# Patient Record
Sex: Male | Born: 1975 | Race: White | Hispanic: No | Marital: Married | Smoking: Former smoker
Health system: Southern US, Community
[De-identification: ages and names within clinical notes are randomized; demographics above are authoritative.]

## PROBLEM LIST (undated history)

## (undated) DIAGNOSIS — G2581 Restless legs syndrome: Secondary | ICD-10-CM

## (undated) DIAGNOSIS — Z789 Other specified health status: Secondary | ICD-10-CM

## (undated) DIAGNOSIS — R079 Chest pain, unspecified: Secondary | ICD-10-CM

## (undated) DIAGNOSIS — M7712 Lateral epicondylitis, left elbow: Secondary | ICD-10-CM

## (undated) HISTORY — DX: Other specified health status: Z78.9

## (undated) HISTORY — DX: Lateral epicondylitis, left elbow: M77.12

## (undated) HISTORY — PX: WISDOM TOOTH EXTRACTION: SHX21

## (undated) HISTORY — DX: Restless legs syndrome: G25.81

## (undated) HISTORY — DX: Chest pain, unspecified: R07.9

---

## 2016-11-21 ENCOUNTER — Telehealth: Payer: Self-pay | Admitting: Behavioral Health

## 2016-11-21 ENCOUNTER — Encounter: Payer: Self-pay | Admitting: Behavioral Health

## 2016-11-21 NOTE — Telephone Encounter (Signed)
Pre-Visit Call completed with patient and chart updated.   Pre-Visit Info documented in Specialty Comments under [employer].    

## 2016-11-23 ENCOUNTER — Ambulatory Visit: Payer: Self-pay | Admitting: Family Medicine

## 2016-11-23 ENCOUNTER — Telehealth: Payer: Self-pay | Admitting: General Practice

## 2016-11-23 NOTE — Telephone Encounter (Signed)
OK to no charge. TY.

## 2016-11-23 NOTE — Telephone Encounter (Signed)
Pt called in at 3:00, he is scheduled to be established with provider. Pt says that he got stuck at work and didn't realize the time. Pt rescheduled to next week,   Should pt be charged?

## 2016-12-01 ENCOUNTER — Encounter: Payer: Self-pay | Admitting: Family Medicine

## 2016-12-01 ENCOUNTER — Ambulatory Visit (HOSPITAL_BASED_OUTPATIENT_CLINIC_OR_DEPARTMENT_OTHER)
Admission: RE | Admit: 2016-12-01 | Discharge: 2016-12-01 | Disposition: A | Discharge status: Home / Self Care | Payer: BLUE CROSS/BLUE SHIELD | Source: Ambulatory Visit | Attending: Family Medicine | Admitting: Family Medicine

## 2016-12-01 ENCOUNTER — Ambulatory Visit (INDEPENDENT_AMBULATORY_CARE_PROVIDER_SITE_OTHER): Payer: BLUE CROSS/BLUE SHIELD | Admitting: Family Medicine

## 2016-12-01 VITALS — BP 112/84 | HR 80 | Temp 98.7°F | Ht 70.5 in | Wt 212.0 lb

## 2016-12-01 DIAGNOSIS — G8929 Other chronic pain: Secondary | ICD-10-CM | POA: Diagnosis not present

## 2016-12-01 DIAGNOSIS — M25511 Pain in right shoulder: Secondary | ICD-10-CM | POA: Diagnosis not present

## 2016-12-01 DIAGNOSIS — G2581 Restless legs syndrome: Secondary | ICD-10-CM

## 2016-12-01 MED ORDER — ROPINIROLE HCL 0.5 MG PO TABS: 0.5000 mg | ORAL_TABLET | Freq: Every day | ORAL | 2 refills | Status: DC

## 2016-12-01 NOTE — Progress Notes (Signed)
Chief Complaint  Patient presents with  . Establish Care       New Patient Visit SUBJECTIVE: HPI: Tyler Stevens is an 41 y.o.male who is being seen for establishing care.  In his early 20's he started having RLS, used to be manageable, but appears to be bothering him more over the past 2-3 years. He feels a desire to move his legs. This happens more frequently when he is resting. He feels a relief once he does move his legs. He has never been on any medicine in the past. He denies any areas of easy bruising/bleeding and denies any personal history of iron deficiency.  R shoulder pain around 5-6 mo ago when lifting a cabinet. Does not catch or lock. Was improving, but steadily getting worse. No neurologic signs/symptoms. No palliation. Provocation include overhead activity, reaching behind him or lifting. Other than rest/activity modification, he is not tried anything at home.  No Known Allergies  Past Medical History:  Diagnosis Date  . Medical history non-contributory   . Restless legs    Past Surgical History:  Procedure Laterality Date  . WISDOM TOOTH EXTRACTION     Social History   Social History  . Marital status: Married   Social History Main Topics  . Smoking status: Former Smoker    Types: Cigarettes    Quit date: 07/13/2014  . Smokeless tobacco: Never Used  . Alcohol use No  . Drug use: Yes    Types: Marijuana   Family History  Problem Relation Age of Onset  . Cancer Neg Hx      Current Outpatient Prescriptions:  .  rOPINIRole (REQUIP) 0.5 MG tablet, Take 1 tablet (0.5 mg total) by mouth at bedtime. Take 1/2 tab daily for the first 3 days., Disp: 30 tablet, Rfl: 2  ROS MSK: +R shoulder pain  Neuro: Denies numbness/tingling   OBJECTIVE: BP 112/84 (BP Location: Left Arm, Patient Position: Sitting, Cuff Size: Large)   Pulse 80   Temp 98.7 F (37.1 C) (Oral)   Ht 5' 10.5" (1.791 m)   Wt 212 lb (96.2 kg)   SpO2 94%   BMI 29.99 kg/m   Constitutional: -   VS reviewed -  Well developed, well nourished, appears stated age -  No apparent distress  Psychiatric: -  Oriented to person, place, and time -  Memory intact -  Affect and mood normal -  Fluent conversation, good eye contact -  Judgment and insight age appropriate  Eye: -  Conjunctivae clear, no discharge -  Pupils symmetric, round, reactive to light  ENMT: -  MMM    Pharynx moist, no exudate, no erythema  Neck: -  No gross swelling, no palpable masses -  Thyroid midline, not enlarged, mobile, no palpable masses  Cardiovascular: -  RRR -  No LE edema  Respiratory: -  Normal respiratory effort, no accessory muscle use, no retraction -  Breath sounds equal, no wheezes, no ronchi, no crackles  Neurological:  -  CN II - XII grossly intact -  Sensation grossly intact to light touch, equal bilaterally -  DTR's in UE's b/l   Musculoskeletal: -  No clubbing, no cyanosis -  R shoulder- No deformity or TTP, decreased overhead range of motion, positive Neer's, empty can, [employer]; negative crossover, crossover O'Brien's, O'Brien's, speed's, Yergason's, liftoff   Skin: -  No significant lesion on inspection -  Warm and dry to palpation   ASSESSMENT/PLAN: Chronic right shoulder pain - Plan: DG Shoulder Right  RLS (restless legs syndrome) - Plan: CBC, Ferritin, IBC panel, rOPINIRole (REQUIP) 0.5 MG tablet  Concern for rotator cuff despite age. I am not particularly concerned with labral pathosis given exam. Will rec heat, activity as tolerated, home stretches/exercises. Call if no better, will try PT. I would like to see him again if still no better. We'll check iron studies to make sure he is not deficient. Start Requip. Half a tab a day for the first 3 days then 1 tab daily. Patient should return in 1 mo to recheck RLS. The patient voiced understanding and agreement to the plan.   Jilda Roche [place], DO 12/01/16  4:41 PM

## 2016-12-01 NOTE — Patient Instructions (Signed)
Give [place] 2-3 business days to get the results of your labs back. If labs are normal, you will likely receive a letter in the mail unless you have MyChart. This can take longer than 2-3 business days.   Let me know in the next 3-4 weeks if you are not getting better. We will try physical therapy at that point.   EXERCISES  RANGE OF MOTION (ROM) AND STRETCHING EXERCISES These exercises may help you when beginning to rehabilitate your injury. Your symptoms may resolve with or without further involvement from your physician, physical therapist or athletic trainer. While completing these exercises, remember:   Restoring tissue flexibility helps normal motion to return to the joints. This allows healthier, less painful movement and activity.  An effective stretch should be held for at least 30 seconds.  A stretch should never be painful. You should only feel a gentle lengthening or release in the stretched tissue.  ROM - Pendulum  Bend at the waist so that your right / left arm falls away from your body. Support yourself with your opposite hand on a solid surface, such as a table or a countertop.  Your right / left arm should be perpendicular to the ground. If it is not perpendicular, you need to lean over farther. Relax the muscles in your right / left arm and shoulder as much as possible.  Gently sway your hips and trunk so they move your right / left arm without any use of your right / left shoulder muscles.  Progress your movements so that your right / left arm moves side to side, then forward and backward, and finally, both clockwise and counterclockwise.  Complete 10-15 repetitions in each direction. Many people use this exercise to relieve discomfort in their shoulder as well as to gain range of motion. Repeat 2-3 times. Complete this exercise once per day.  STRETCH - Flexion, Standing  Stand with good posture. With an underhand grip on your right / left hand and an overhand grip on the  opposite hand, grasp a broomstick or cane so that your hands are a little more than shoulder-width apart.  Keeping your right / left elbow straight and shoulder muscles relaxed, push the stick with your opposite hand to raise your right / left arm in front of your body and then overhead. Raise your arm until you feel a stretch in your right / left shoulder, but before you have increased shoulder pain.  Try to avoid shrugging your right / left shoulder as your arm rises by keeping your shoulder blade tucked down and toward your mid-back spine. Hold 15-20 seconds.  Slowly return to the starting position. Repeat 2-3 times. Complete this exercise once per day.  STRETCH - Internal Rotation  Place your right / left hand behind your back, palm-up.  Throw a towel or belt over your opposite shoulder. Grasp the towel/belt with your right / left hand.  While keeping an upright posture, gently pull up on the towel/belt until you feel a stretch in the front of your right / left shoulder.  Avoid shrugging your right / left shoulder as your arm rises by keeping your shoulder blade tucked down and toward your mid-back spine.  Hold 15-20. Release the stretch by lowering your opposite hand. Repeat 2-3 times. Complete this exercise once per day.  STRETCH - External Rotation and Abduction  Stagger your stance through a doorframe. It does not matter which foot is forward.  As instructed by your physician, physical therapist  or athletic trainer, place your hands: ? And forearms above your head and on the door frame. ? And forearms at head-height and on the door frame. ? At elbow-height and on the door frame.  Keeping your head and chest upright and your stomach muscles tight to prevent over-extending your low-back, slowly shift your weight onto your front foot until you feel a stretch across your chest and/or in the front of your shoulders.  Hold 15-20 seconds. Shift your weight to your back foot to  release the stretch. Repeat 2-3 times. Complete this stretch once per day.   STRENGTHENING EXERCISES  These exercises may help you when beginning to rehabilitate your injury. They may resolve your symptoms with or without further involvement from your physician, physical therapist or athletic trainer. While completing these exercises, remember:   Muscles can gain both the endurance and the strength needed for everyday activities through controlled exercises.  Complete these exercises as instructed by your physician, physical therapist or athletic trainer. Progress the resistance and repetitions only as guided.  You may experience muscle soreness or fatigue, but the pain or discomfort you are trying to eliminate should never worsen during these exercises. If this pain does worsen, stop and make certain you are following the directions exactly. If the pain is still present after adjustments, discontinue the exercise until you can discuss the trouble with your clinician.  If advised by your physician, during your recovery, avoid activity or exercises which involve actions that place your right / left hand or elbow above your head or behind your back or head. These positions stress the tissues which are trying to heal.  STRENGTH - Scapular Depression and Adduction  With good posture, sit on a firm chair. Supported your arms in front of you with pillows, arm rests or a table top. Have your elbows in line with the sides of your body.  Gently draw your shoulder blades down and toward your mid-back spine. Gradually increase the tension without tensing the muscles along the top of your shoulders and the back of your neck.  Hold for 5 seconds. Slowly release the tension and relax your muscles completely before completing the next repetition.  After you have practiced this exercise, remove the arm support and complete it in standing as well as sitting. Repeat 2 times. Complete this exercise every other  day.   STRENGTH - External Rotators  Secure a rubber exercise band/tubing to a fixed object so that it is at the same height as your right / left elbow when you are standing or sitting on a firm surface.  Stand or sit so that the secured exercise band/tubing is at your side that is not injured.  Bend your elbow 90 degrees. Place a folded towel or small pillow under your right / left arm so that your elbow is a few inches away from your side.  Keeping the tension on the exercise band/tubing, pull it away from your body, as if pivoting on your elbow. Be sure to keep your body steady so that the movement is only coming from your shoulder rotating.  Hold 5 seconds. Release the tension in a controlled manner as you return to the starting position. Repeat 2 times. Complete this exercise every other day.   STRENGTH - Supraspinatus  Stand or sit with good posture. Grasp a 2-3 lb weight or an exercise band/tubing so that your hand is "thumbs-up," like when you shake hands.  Slowly lift your right / left hand from  your thigh into the air, traveling about 30 degrees from straight out at your side. Lift your hand to shoulder height or as far as you can without increasing any shoulder pain. Initially, many people do not lift their hands above shoulder height.  Avoid shrugging your right / left shoulder as your arm rises by keeping your shoulder blade tucked down and toward your mid-back spine.  Hold for 4-5 seconds. Control the descent of your hand as you slowly return to your starting position. Repeat 2 times. Complete this exercise every other day.   STRENGTH - Shoulder Extensors  Secure a rubber exercise band/tubing so that it is at the height of your shoulders when you are either standing or sitting on a firm arm-less chair.  With a thumbs-up grip, grasp an end of the band/tubing in each hand. Straighten your elbows and lift your hands straight in front of you at shoulder height. Step back away  from the secured end of band/tubing until it becomes tense.  Squeezing your shoulder blades together, pull your hands down to the sides of your thighs. Do not allow your hands to go behind you.  Hold for 5 seconds. Slowly ease the tension on the band/tubing as you reverse the directions and return to the starting position. Repeat 2 times. Complete this exercise every other day.   STRENGTH - Scapular Retractors  Secure a rubber exercise band/tubing so that it is at the height of your shoulders when you are either standing or sitting on a firm arm-less chair.  With a palm-down grip, grasp an end of the band/tubing in each hand. Straighten your elbows and lift your hands straight in front of you at shoulder height. Step back away from the secured end of band/tubing until it becomes tense.  Squeezing your shoulder blades together, draw your elbows back as you bend them. Keep your upper arm lifted away from your body throughout the exercise.  Hold 5 seconds. Slowly ease the tension on the band/tubing as you reverse the directions and return to the starting position. Repeat 2 times. Complete this exercise every other day.  STRENGTH - Scapular Depressors  Find a sturdy chair without wheels, such as a from a dining room table.  Keeping your feet on the floor, lift your bottom from the seat and lock your elbows.  Keeping your elbows straight, allow gravity to pull your body weight down. Your shoulders will rise toward your ears.  Raise your body against gravity by drawing your shoulder blades down your back, shortening the distance between your shoulders and ears. Although your feet should always maintain contact with the floor, your feet should progressively support less body weight as you get stronger.  Hold 5 seconds. In a controlled and slow manner, lower your body weight to begin the next repetition. Repeat 2 times. Complete this exercise every other day.   This information is not  intended to replace advice given to you by your health care provider. Make sure you discuss any questions you have with your health care provider.  Document Released: 01/12/2005 Document Revised: 03/21/2014 Document Reviewed: 06/12/2008 Elsevier Interactive Patient Education [employer]

## 2016-12-01 NOTE — Progress Notes (Signed)
Pre visit review using our clinic review tool, if applicable. No additional management support is needed unless otherwise documented below in the visit note. 

## 2016-12-02 LAB — IBC PANEL
Iron: 78 ug/dL (ref 42–165)
SATURATION RATIOS: 22.4 % (ref 20.0–50.0)
TRANSFERRIN: 249 mg/dL (ref 212.0–360.0)

## 2016-12-02 LAB — CBC
HEMATOCRIT: 43.3 % (ref 39.0–52.0)
HEMOGLOBIN: 13.9 g/dL (ref 13.0–17.0)
MCHC: 32.2 g/dL (ref 30.0–36.0)
MCV: 85.5 fl (ref 78.0–100.0)
Platelets: 398 10*3/uL (ref 150.0–400.0)
RBC: 5.06 Mil/uL (ref 4.22–5.81)
RDW: 13.2 % (ref 11.5–15.5)
WBC: 9.1 10*3/uL (ref 4.0–10.5)

## 2016-12-02 LAB — FERRITIN: Ferritin: 101.7 ng/mL (ref 22.0–322.0)

## 2017-07-25 ENCOUNTER — Ambulatory Visit: Payer: BLUE CROSS/BLUE SHIELD | Admitting: Family Medicine

## 2017-07-25 ENCOUNTER — Encounter: Payer: Self-pay | Admitting: Family Medicine

## 2017-07-25 VITALS — BP 112/76 | HR 92 | Temp 98.4°F | Ht 70.0 in | Wt 211.0 lb

## 2017-07-25 DIAGNOSIS — G2581 Restless legs syndrome: Secondary | ICD-10-CM | POA: Diagnosis not present

## 2017-07-25 DIAGNOSIS — M7712 Lateral epicondylitis, left elbow: Secondary | ICD-10-CM | POA: Diagnosis not present

## 2017-07-25 HISTORY — DX: Lateral epicondylitis, left elbow: M77.12

## 2017-07-25 HISTORY — DX: Restless legs syndrome: G25.81

## 2017-07-25 NOTE — Progress Notes (Signed)
Pre visit review using our clinic review tool, if applicable. No additional management support is needed unless otherwise documented below in the visit note. 

## 2017-07-25 NOTE — Progress Notes (Signed)
Musculoskeletal Exam  Patient: Tyler Stevens DOB: 10/23/1975  DOS: 07/25/2017  SUBJECTIVE:  Chief Complaint:   Chief Complaint  Patient presents with  . Elbow Injury    left    Tyler Stevens is a 42 y.o.  male for evaluation and treatment of L elbow pain.   Onset:  4 weeks ago.  No inj or change in activity, does work in [employer].  Location: Lateral Elbow Character:  aching and sharp when he uses it and touches Progression of issue:  is unchanged Associated symptoms: None Treatment: to date has been- compression brace.   Neurovascular symptoms: no  ROS: Musculoskeletal/Extremities: +Elbow pain  Past Medical History:  Diagnosis Date  . Medical history non-contributory   . Restless legs     Objective: VITAL SIGNS: BP 112/76 (BP Location: Left Arm, Patient Position: Sitting, Cuff Size: Normal)   Pulse 92   Temp 98.4 F (36.9 C) (Oral)   Ht  (1.778 m)   Wt 211 lb (95.7 kg)   SpO2 95%   BMI 30.28 kg/m  Constitutional: Well formed, well developed. No acute distress. Cardiovascular: Brisk cap refill Thorax & Lungs: No accessory muscle use Musculoskeletal: L elbow.   Normal active range of motion: yes.   Normal passive range of motion: yes Tenderness to palpation: yes, over lat epicondyle Deformity: no Ecchymosis: no +pain with resisted wrist extension Neurologic: Normal sensory function. No focal deficits noted. Psychiatric: Normal mood. Age appropriate judgment and insight. Alert & oriented x 3.    Assessment:  Lateral epicondylitis of left elbow  RLS (restless legs syndrome)  Plan: NSAIDs, Tylenol, ice, forearm strap, stretches/exercises. Trial Mg as Requip not as satisfying.  F/u prn.  The patient voiced understanding and agreement to the plan.   Jilda Roche [place], DO 07/25/17  2:46 PM

## 2017-07-25 NOTE — Patient Instructions (Signed)
Ice/cold pack over area for 10-15 min twice daily.  Ibuprofen 400-600 mg (2-3 over the counter strength tabs) every 6 hours as needed for pain.  OK to take Tylenol 1000 mg (2 extra strength tabs) or 975 mg (3 regular strength tabs) every 6 hours as needed.  Forearm strap for tennis elbow is available over the counter. Band-IT is a common brand that people have had success with.  Try Magnesium over the counter 200-250 mg as needed for the issue. Stay well hydrated.   Elbow and Forearm Exercises It is normal to feel mild stretching, pulling, tightness, or discomfort as you do these exercises, but you should stop right away if you feel sudden pain or your pain gets worse. RANGE OF MOTION EXERCISES These exercises warm up your muscles and joints and improve the movement and flexibility of your injured elbow and forearm. These exercises also help to relieve pain, numbness, and tingling.These exercises are done using the muscles in your injured elbow and forearm. Exercise A: Elbow Flexion, Active 1. Hold your left / right arm at your side, and bend your elbow as far as you can using your left / right arm muscles. 2. Hold this position for 30 seconds. 3. Slowly return to the starting position. Repeat 2 times. Complete this exercise 3 times per week. Exercise B: Elbow Extension, Active 1. Hold your left / right arm at your side, and straighten your elbow as much as you can using your left / right arm muscles. 2. Hold this position for 30 seconds. 3. Slowly return to the starting position. Repeat 2 times. Complete this exercise 3 times per week. Exercise C: Forearm Rotation, Supination, Active 1. Stand or sit with your elbows at your sides. 2. Bend your left / right elbow to an "L" shape (90 degrees). 3. Turn your palm upward until you feel a gentle stretch on the inside of your forearm. 4. Hold this position for 30 seconds. 5. Slowly release and return to the starting position. Repeat 2  times. Complete this exercise 3 times per week. Exercise D: Forearm Rotation, Pronation, Active 1. Stand or sit with your elbows at your side. 2. Bend your left / right elbow to an "L" shape (90 degrees). 3. Turn your left / right palm downward until you feel a gentle stretch on the top of your forearm. 4. Hold this position for 30 seconds. 5. Slowly release and return to the starting position. Repeat2 times. Complete this exercise 3 times per week. STRETCHING EXERCISES These exercises warm up your muscles and joints and improve the movement and flexibility of your injured elbow and forearm. These exercises also help to relieve pain, numbness, and tingling.These exercises are done using your healthy elbow and forearm to help stretch the muscles in your injured elbow and forearm. Exercise E: Elbow Flexion, Active-Assisted  1. Hold your left / right arm at your side, and bend your elbow as much as you can using your left / right arm muscles. 2. Use your other hand to bend your left / right elbow farther. To do this, gently push up on your forearm until you feel a gentle stretch on the back of your elbow. 3. Hold this position for 30 seconds. 4. Slowly return to the starting position. Repeat 2 times. Complete this exercise 3 times per week. Exercise F: Elbow Extension, Active-Assisted  1. Hold your left / right arm at your side, and straighten your elbow as much as you can using your left / right  arm muscles. 2. Use your other hand to straighten the left / right elbow farther. To do this, gently push down on your forearm until you feel a gentle stretch on the inside of your elbow. 3. Hold this position for 30 seconds. 4. Slowly return to the starting position. Repeat 2 times. Complete this exercise 3 times per weeky. Exercise G: Forearm Rotation, Supination, Active-Assisted  1. Sit with your left / right elbow bent in an "L" shape (90 degrees) with your forearm resting on a  table. 2. Keeping your upper body and shoulder still, rotate your forearm so your left / right palm faces upward. 3. Use your other hand to help rotate your forearm further until you feel a gentle to moderate stretch. 4. Hold this position for 30 seconds. 5. Slowly release the stretch and return to the starting position. Repeat 2 times. Complete this exercise 3 times per week. Exercise H: Forearm Rotation, Pronation, Active-Assisted  1. Sit with your left / right elbow bent in an "L" shape (90 degrees) with your forearm resting on a table. 2. Keeping your upper body and shoulder still, rotate your forearm so your palm faces the tabletop. 3. Use your other hand to help rotate your forearm further until you feel a gentle to moderate stretch. 4. Hold this position for 30 seconds. 5. Slowly release the stretch and return to the starting position. Repeat 2 times. Complete this exercise 3 times per week. Exercise I: Elbow Flexion, Supine, Passive 1. Lie on your back. 2. Extend your left / right arm up in the air, bracing it with your other hand. 3. Let your left / right your hand slowly lower toward your shoulder, while your elbow stays pointed toward the ceiling. You should feel a gentle stretch along the back of your upper arm and elbow. 4. If instructed by your health care provider, you may increase the intensity of your stretch by adding a small wrist weight or hand weight. 5. Hold this position for 3 seconds. 6. Slowly return to the starting position. Repeat 2 times. Complete this exercise 3 times per week. Exercise J: Elbow Extension, Supine, Passive  1. Lie on your back. Make sure that you are in a comfortable position that lets you relax your arm muscles. 2. Place a folded towel under your left / right upper arm so your elbow and shoulder are at the same height. Straighten your left / right arm so your elbow does not rest on the bed or towel. 3. Let the weight of your hand stretch your  elbow. Keep your arm and chest muscles relaxed. You should feel a stretch on the inside of your elbow. 4. If told by your health care provider, you may increase the intensity of your stretch by adding a small wrist weight or hand weight. 5. Hold this position for 30 seconds. 6. Slowly release the stretch. Repeat 2 times. Complete this exercise 3 times per week. STRENGTHENING EXERCISES These exercises build strength and endurance in your elbow and forearm. Endurance is the ability to use your muscles for a long time, even after they get tired. Exercise K: Elbow Flexion, Isometric  1. Stand or sit up straight. 2. Bend your left / right elbow in an "L" shape (90 degrees) and turn your palm up so your forearm is at the height of your waist. 3. Place your other hand on top of your forearm. Gently push down as your left / right arm resists. Push as hard as you  can with both arms without causing any pain or movement at your left / right elbow. 4. Hold this position for 3 seconds. 5. Slowly release the tension in both arms. Let your muscles relax completely before repeating. Repeat 2 times. Complete this exercise 3 times per week. Exercise L: Elbow Extensors, Isometric  1. Stand or sit up straight. 2. Place your left / right arm so your palm faces your abdomen and it is at the height of your waist. 3. Place your other hand on the underside of your forearm. Gently push up as your left / right arm resists. Push as hard as you can with both arms, without causing any pain or movement at your left / right elbow. 4. Hold this position for 3 seconds. 5. Slowly release the tension in both arms. Let your muscles relax completely before repeating. Repeat _______2___ times. Complete this exercise 3 times per week. Exercise M: Elbow Flexion With Forearm Palm Up  1. Sit upright on a firm chair without armrests, or stand. 2. Place your left / right arm at your side with your palm facing forward. 3. Holding a 5  lbweight or gripping a rubber exercise band or tubing, bend your elbow to bring your hand toward your shoulder. 4. Hold this position for 3 seconds. 5. Slowly return to the starting position. Repeat 2 times. Complete this exercise 3 times per week. Exercise N: Elbow Extension  1. Sit on a firm chair without armrests, or stand. 2. Keeping your upper arms at your sides, bring both hands up toward your left / right shoulder while you grip a rubber exercise band or tubing. Your left / right hand should be just below the other hand. 3. Straighten your left / right elbow. 4. Hold this position for 3 seconds. 5. Control the resistance of the band or tubing as your hand returns to your side. Repeat 2 times. Complete this exercise 3 times per week. Exercise O: Forearm Rotation, Supination  1. Sit with your left / right forearm supported on a table. Keep your elbow at waist height. 2. Rest your hand over the edge of the table with your palm facing down. 3. Gently hold a lightweight hammer. 4. Without moving your elbow, slowly rotate your forearm to turn your palm and hand upward to a "thumbs-up" position. 5. Hold this position for 3 seconds. 6. Slowly return to the starting position. Repeat 2 times. Complete this exercise 3 times per week. Exercise P: Forearm Rotation, Pronation  1. Sit with your left / right forearm supported on a table. Keep your elbow below shoulder height. 2. Rest your hand over the edge of the table with your palm facing up. 3. Gently hold a lightweight hammer. 4. Without moving your elbow, slowly rotate your forearm to turn your palm and hand upward to a "thumbs-up" position. 5. Hold this position for 3 seconds. 6. Slowly return to the starting position. Repeat 2 times. Complete this exercise 3 times per week.  Make sure you discuss any questions you have with your health care provider. Document Released: 01/12/2005 Document Revised: 07/09/2015 Document Reviewed:  11/23/2014 Elsevier Interactive Patient Education  [employer].

## 2018-04-23 ENCOUNTER — Ambulatory Visit: Payer: Self-pay

## 2018-04-23 NOTE — Telephone Encounter (Signed)
Returned call to patient who states that he has had several bouts of chest pain.  He states that it is in the center of his chest and travels to his sides but not into his back arm.  He does not get nauseous, sweats, SOB. He states that raising his arm relieves the pain.  He denies injury. He rates the pain at 5-6. He has no risk factors. Appointment scheduled per protocol.  Care advice read to patient. Pt verbalized understanding of all instructions.  Reason for Disposition . [1] Chest pain lasts > 5 minutes AND [2] occurred > 3 days ago (72 hours) AND [3] NO chest pain or cardiac symptoms now  Answer Assessment - Initial Assessment Questions 1. LOCATION: "Where does it hurt?"       Middle of chest 2. RADIATION: "Does the pain go anywhere else?" (e.g., into neck, jaw, arms, back)     Radiates to sides 3. ONSET: "When did the chest pain begin?" (Minutes, hours or days)     Saturday night 4. PATTERN "Does the pain come and go, or has it been constant since it started?"  "Does it get worse with exertion?"      no 5. DURATION: "How long does it last" (e.g., seconds, minutes, hours)     6. SEVERITY: "How bad is the pain?"  (e.g., Scale 1-10; mild, moderate, or severe)    - MILD (1-3): doesn't interfere with normal activities     - MODERATE (4-7): interferes with normal activities or awakens from sleep    - SEVERE (8-10): excruciating pain, unable to do any normal activities    5-6 7. CARDIAC RISK FACTORS: "Do you have any history of heart problems or risk factors for heart disease?" (e.g., prior heart attack, angina; high blood pressure, diabetes, being overweight, high cholesterol, smoking, or strong family history of heart disease)     no 8. PULMONARY RISK FACTORS: "Do you have any history of lung disease?"  (e.g., blood clots in lung, asthma, emphysema, birth control pills)    no 9. CAUSE: "What do you think is causing the chest pain?"     nothing 10. OTHER SYMPTOMS: "Do you have any  other symptoms?" (e.g., dizziness, nausea, vomiting, sweating, fever, difficulty breathing, cough)       no 11. PREGNANCY: "Is there any chance you are pregnant?" "When was your last menstrual period?"       No  Protocols used: CHEST PAIN-A-AH

## 2018-04-24 ENCOUNTER — Encounter: Payer: Self-pay | Admitting: Medical

## 2018-04-24 ENCOUNTER — Telehealth: Payer: Self-pay | Admitting: Medical

## 2018-04-24 ENCOUNTER — Ambulatory Visit (INDEPENDENT_AMBULATORY_CARE_PROVIDER_SITE_OTHER): Payer: BLUE CROSS/BLUE SHIELD | Admitting: Medical

## 2018-04-24 VITALS — BP 125/78 | HR 98 | Temp 98.3°F | Resp 16 | Ht 70.0 in | Wt 215.8 lb

## 2018-04-24 DIAGNOSIS — R0789 Other chest pain: Secondary | ICD-10-CM

## 2018-04-24 DIAGNOSIS — Z87891 Personal history of nicotine dependence: Secondary | ICD-10-CM | POA: Diagnosis not present

## 2018-04-24 DIAGNOSIS — R079 Chest pain, unspecified: Secondary | ICD-10-CM | POA: Diagnosis not present

## 2018-04-24 LAB — TROPONIN I

## 2018-04-24 NOTE — Patient Instructions (Addendum)
You had some atypical chest pain recently that went away after 5 to 10 minutes with each event.  Last event of chest pain occurred with activity.  Your EKG shows normal sinus rhythm.  No ischemia seen on EKG.  Since the last event may have been on Sunday night, will get a troponin level today and see if there is any elevation.  With your reported pain recently, history of smoking and family history of CAD, I did go ahead and place referral for you to see cardiologist.  During the interim if you have any recurrent episodes of chest pain then recommend that you be seen in the emergency department for EKG at the same time and repeat cardiac protein studies.  You do have history of some heartburn on occasion.  No overt symptoms recently.  But I do think since some of your symptoms have occurred while sleeping it is best for you to get some famotidine over-the-counter and use 1 to 2 tablets early evening.  This would prevent any heartburn from occurring.  This would be helpful as sometimes heartburn can be interpreted as chest pain.   We will place future labs for you to get done on Monday or Tuesday next week.  These will be metabolic panel and lipid panel.  This will start to assess some of your risk factors.  If no cardiologist appointment by late next week then I do want you to see your PCP or me.  You could try to schedule a close to 1 PM in the afternoon or other times.  1:00 appointment time might be more convenient/quicker for you.

## 2018-04-24 NOTE — Progress Notes (Signed)
Subjective:    Patient ID: Tyler Stevens, male    DOB: Aug 22, 1975, 43 y.o.   MRN: [id number]  HPI  Pt in states several weeks ago he woke up with some chest pain. He states he noticed the pain transitioning from sleeping and waking. He thinks chest pain would wake him up. 1st time he thought pain resolved by the time he woke up and walked to the door. The second time he states he had same type pain. Then pain eased off after few minutes. No belching or burping. He states pain did not feel like heart burn.   He also reports 3rd time had some chest pain that occurred after some vigorous intercourse. Pain lasted for about 10 minutes then subsided. Pt thinks Saturday night or Sunday night was when had 3rd event. He is not sure.  Pain did not have any jaw pain, shoulder pain or shortness of pain during above 3 events.  Pt used to smoke. Stopped 3 years ago. He did smoke 43 year old until 43 yo. He smoked about a pack a day. Pt dad had 2 stents. His dad is a smoker. Dad had stents placed at 41 yo. Pt not aware if he has high cholesterol. No known diabetes. Pt does vape marijuana. About 1 gram of concentrate marijuana. No high blood pressure.  Some hx of heartburn. He is not taking any meds over the counter. He states if eats healthy won't need anything. He does not want to use meds in past. But understands short term benefit while working up the above.     Review of Systems  Constitutional: Negative for chills, fatigue and fever.  Respiratory: Negative for cough, chest tightness, shortness of breath and wheezing.   Cardiovascular: Positive for chest pain. Negative for palpitations.       See hpi. But note no current pain.  Gastrointestinal: Negative for abdominal pain.  Musculoskeletal: Negative for back pain, myalgias and neck stiffness.  Skin: Negative for rash.  Neurological: Negative for dizziness, syncope, weakness and headaches.  Hematological: Negative for adenopathy.    Psychiatric/Behavioral: Negative for agitation and behavioral problems. The patient is not nervous/anxious.     Past Medical History:  Diagnosis Date  . Medical history non-contributory   . Restless legs      Social History   Socioeconomic History  . Marital status: Married    Spouse name: Not on file  . Number of children: Not on file  . Years of education: Not on file  . Highest education level: Not on file  Occupational History  . Not on file  Social Needs  . Financial resource strain: Not on file  . Food insecurity:    Worry: Not on file    Inability: Not on file  . Transportation needs:    Medical: Not on file    Non-medical: Not on file  Tobacco Use  . Smoking status: Former Smoker    Types: Cigarettes    Last attempt to quit: 07/13/2014    Years since quitting: 3.7  . Smokeless tobacco: Never Used  Substance and Sexual Activity  . Alcohol use: No  . Drug use: Yes    Types: Marijuana  . Sexual activity: Not on file  Lifestyle  . Physical activity:    Days per week: Not on file    Minutes per session: Not on file  . Stress: Not on file  Relationships  . Social connections:    Talks on phone: Not on file  Gets together: Not on file    Attends religious service: Not on file    Active member of club or organization: Not on file    Attends meetings of clubs or organizations: Not on file    Relationship status: Not on file  . Intimate partner violence:    Fear of current or ex partner: Not on file    Emotionally abused: Not on file    Physically abused: Not on file    Forced sexual activity: Not on file  Other Topics Concern  . Not on file  Social History Narrative  . Not on file    Past Surgical History:  Procedure Laterality Date  . WISDOM TOOTH EXTRACTION      Family History  Problem Relation Age of Onset  . Cancer Neg Hx     No Known Allergies  No current outpatient medications on file prior to visit.   No current facility-administered  medications on file prior to visit.     Pulse 98   Temp 98.3 F (36.8 C) (Oral)   Resp 16   Ht 5\' 10"  (1.778 m)   Wt 215 lb 12.8 oz (97.9 kg)   SpO2 97%   BMI 30.96 kg/m       Objective:   Physical Exam  General Mental Status- Alert. General Appearance- Not in acute distress.   Skin General: Color- Normal Color. Moisture- Normal Moisture.  Neck Carotid Arteries- Normal color. Moisture- Normal Moisture. No carotid bruits. No JVD.  Chest and Lung Exam Auscultation: Breath Sounds:-Normal.  Cardiovascular Auscultation:Rythm- Regular. Murmurs & Other Heart Sounds:Auscultation of the heart reveals- No Murmurs.  Abdomen Inspection:-Inspeection Normal. Palpation/Percussion:Note:No mass. Palpation and Percussion of the abdomen reveal- Non Tender, Non Distended + BS, no rebound or guarding.    Neurologic Cranial Nerve exam:- CN III-XII intact(No nystagmus), symmetric smile. Strength:- 5/5 equal and symmetric strength both upper and lower extremities.  Anterior chest- no pain on palpation.     Assessment & Plan:  [phone].  You had some atypical chest pain recently that went away after 5 to 10 minutes with each event.  Last event of chest pain occurred with activity.  Your EKG shows normal sinus rhythm.  No ischemia seen on EKG.  Since the last event may have been on Sunday night, will get a troponin level today and see if there is any elevation.  With your reported pain recently, history of smoking and family history of CAD, I did go ahead and place referral for you to see cardiologist.  During the interim if you have any recurrent episodes of chest pain then recommend that you be seen in the emergency department for EKG at the same time and repeat cardiac protein studies.  You do have history of some heartburn on occasion.  No overt symptoms recently.  But I do think since some of your symptoms have occurred while sleeping it is best for you to  get some famotidine  over-the-counter and use 1 to 2 tablets early evening.  This would prevent any heartburn from occurring.  This would be helpful as sometimes heartburn can be interpreted as chest pain.   We will place future labs for you to get done on Monday or Tuesday next week.  These will be metabolic panel and lipid panel.  This will start to assess some of your risk factors.  If no cardiologist appointment by late next week then I do want you to see your PCP or me.  You could  try to schedule a close to 1 PM in the afternoon or other times.  1:00 appointment time might be more convenient/quicker for you.  Esperanza RichtersEdward Merwyn Hodapp, PA-C

## 2018-04-24 NOTE — Telephone Encounter (Signed)
Pt blood pressure not put in epic Will you help to find vital sheet so can put it in chart tomorrow. Let me know if you found. Can you put that in or do I have to make the addendum.

## 2018-04-25 ENCOUNTER — Encounter: Payer: Self-pay | Admitting: Medical

## 2018-04-25 NOTE — Telephone Encounter (Signed)
Done

## 2018-04-27 ENCOUNTER — Telehealth: Payer: Self-pay | Admitting: *Deleted

## 2018-04-27 NOTE — Telephone Encounter (Signed)
Received Lab Report results from [facility]; forwarded to provider/SLS  02/14

## 2018-04-27 NOTE — Telephone Encounter (Signed)
Received Dermatopathology Report results from [facility]; forwarded to provider/SLS 02/14     

## 2018-05-04 ENCOUNTER — Ambulatory Visit: Payer: BLUE CROSS/BLUE SHIELD | Admitting: Family Medicine

## 2018-05-07 DIAGNOSIS — R079 Chest pain, unspecified: Secondary | ICD-10-CM

## 2018-05-07 HISTORY — DX: Chest pain, unspecified: R07.9

## 2018-05-07 NOTE — Progress Notes (Signed)
Cardiology Office Note:    Date:  05/08/2018   ID:  Tyler Stevens, DOB [birth year], MRN [id number]  PCP:  Sharlene Dory, DO  Cardiologist:  Norman Herrlich, MD   Referring MD: Esperanza Richters, PA-C  ASSESSMENT:    1. Chest pain in adult   2.      Chronic heartburn PLAN:    In order of problems listed above:  1. Chest pain is atypical nonexertional but has elements of angina.  He will undergo further evaluation with cardiac CTA and as part of his labs for contrast I will check a lipid profile.  Given a prescription for nitroglycerin that he can use if he has a severe episode.  Despite the absence of diabetes hypertension hyperlipidemia he is at increased risk with age male sex and former smoking status.  I strongly encouraged her to continue with smoking cessation he is now 3 years from his quit date 2. He has symptoms quite suggestive of reflux and potentially has esophageal chest pain start on a PPI await results of CTA if normal would benefit from ongoing treatment and referral to GI  Next appointment in 6 weeks   Medication Adjustments/Labs and Tests Ordered: Current medicines are reviewed at length with the patient today.  Concerns regarding medicines are outlined above.  Orders Placed This Encounter  Procedures  . CT CORONARY MORPH W/CTA COR W/SCORE W/CA W/CM &/OR WO/CM  . CT CORONARY FRACTIONAL FLOW RESERVE DATA PREP  . CT CORONARY FRACTIONAL FLOW RESERVE FLUID ANALYSIS  . EKG 12-Lead   No orders of the defined types were placed in this encounter.    Chief Complaint  Patient presents with  . Chest Pain    History of Present Illness:    Tyler Stevens is a 43 y.o. male who is being seen today for the evaluation of chest pain at the request of Esperanza Richters, [place].   EKG 04/24/18 Normal, troponin I undetectable.  Last month he has had several episodes of chest pain initially occurred about once a week would awaken from sleep with brief momentary he  described as mild to moderate substernal radiated to both sides of his chest and we get out of bed and it would resolve and last a few minutes no associated dyspepsia heartburn pain on swallowing shortness of breath diaphoresis nausea or vomiting it was not pleuritic in nature.  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  He really has no cardiovascular risk factors except being a previous smoker he has not had a lipid profile and will perform it today along with a CMP in order to look at renal function for cardiac CTA.  He does have a history of what he calls chronic heartburn and is better when he takes milk and over-the-counter antacids.  He has no pain on swallowing the evenings that he had the episodes he did not have acid washing to his mouth and he is never had a GI evaluation.  Reviewed the options for evaluation of CAD and after discussion of traditional stress testing versus cardiac CTA have opted for cardiac CTA with additional information of evaluating the origin of coronary arteries for anomalous coronary artery disease the presence or absence of CAD and the other associated findings of a chest CT particularly looking for signs of chronic  esophageal disease in the interim and when to place him on a PPI.  I gave him a prescription for nitroglycerin if he has another severe episode.  Note he had an outpatient troponin which was normal is a low risk group.  He has no history of congenital rheumatic heart disease or heart murmur he has had no syncope.  Past Medical History:  Diagnosis Date  . Chest pain in adult 05/07/2018  . Lateral epicondylitis of left elbow 07/25/2017  . Medical history non-contributory   . Restless legs   . RLS (restless legs syndrome) 07/25/2017    Past Surgical History:  Procedure Laterality Date  .  WISDOM TOOTH EXTRACTION      Current Medications: No outpatient medications have been marked as taking for the 05/08/18 encounter (Office Visit) with Baldo DaubMunley, Heena Woodbury J, MD.     Allergies:   Patient has no known allergies.   Social History   Socioeconomic History  . Marital status: Married    Spouse name: Not on file  . Number of children: Not on file  . Years of education: Not on file  . Highest education level: Not on file  Occupational History  . Not on file  Social Needs  . Financial resource strain: Not on file  . Food insecurity:    Worry: Not on file    Inability: Not on file  . Transportation needs:    Medical: Not on file    Non-medical: Not on file  Tobacco Use  . Smoking status: Former Smoker    Packs/day: 1.00    Years: 25.00    Pack years: 25.00    Types: Cigarettes    Last attempt to quit: 07/13/2014    Years since quitting: 3.8  . Smokeless tobacco: Never Used  Substance and Sexual Activity  . Alcohol use: Yes    Comment: very rarely  . Drug use: Yes    Types: Marijuana  . Sexual activity: Not on file  Lifestyle  . Physical activity:    Days per week: Not on file    Minutes per session: Not on file  . Stress: Not on file  Relationships  . Social connections:    Talks on phone: Not on file    Gets together: Not on file    Attends religious service: Not on file    Active member of club or organization: Not on file    Attends meetings of clubs or organizations: Not on file    Relationship status: Not on file  Other Topics Concern  . Not on file  Social History Narrative  . Not on file     Family History: The patient's family history includes Arrhythmia in his father; Heart Problems in his father. There is no history of Cancer. And a maternal grandmother had a pacemaker and CAD ROS:   Review of Systems  Constitution: Negative.  HENT: Negative.   Eyes: Negative.   Cardiovascular: Positive for chest pain.  Respiratory: Positive for snoring.     Endocrine: Negative.   Hematologic/Lymphatic: Negative.   Skin: Negative.   Musculoskeletal: Negative.   Gastrointestinal: Positive for heartburn (constant).  Genitourinary: Negative.   Neurological: Negative.   Psychiatric/Behavioral: Negative.   Allergic/Immunologic: Negative.    Please see the history of present illness.     All other systems reviewed and are negative.  EKGs/Labs/Other Studies Reviewed:    The following studies were reviewed today:   EKG:  EKG is  ordered today.  The ekg ordered  today demonstrates remains normal no evidence of infarction or ischemia rhythm is sinus conduction intervals are normal  Recent Labs: No results found for requested labs within last 8760 hours.  Recent Lipid Panel No results found for: CHOL, TRIG, HDL, CHOLHDL, VLDL, LDLCALC, LDLDIRECT  Physical Exam:    VS:  BP 112/78 (BP Location: Left Arm, Patient Position: Sitting, Cuff Size: Large)   Pulse 97   Ht 5\' 10"  (1.778 m)   Wt 215 lb 1.9 oz (97.6 kg)   SpO2 97%   BMI 30.87 kg/m     Wt Readings from Last 3 Encounters:  05/08/18 215 lb 1.9 oz (97.6 kg)  04/24/18 215 lb 12.8 oz (97.9 kg)  07/25/17 211 lb (95.7 kg)     GEN:  Well nourished, well developed in no acute distress HEENT: Normal NECK: No JVD; No carotid bruits LYMPHATICS: No lymphadenopathy CARDIAC: RRR, no murmurs, rubs, gallops RESPIRATORY:  Clear to auscultation without rales, wheezing or rhonchi  ABDOMEN: Soft, non-tender, non-distended MUSCULOSKELETAL:  No edema; No deformity  SKIN: Warm and dry NEUROLOGIC:  Alert and oriented x 3 PSYCHIATRIC:  Normal affect     Signed, Norman Herrlich, MD  05/08/2018 3:34 PM    [facility]

## 2018-05-08 ENCOUNTER — Ambulatory Visit: Payer: BLUE CROSS/BLUE SHIELD | Admitting: Cardiology

## 2018-05-08 ENCOUNTER — Encounter: Payer: Self-pay | Admitting: Cardiology

## 2018-05-08 VITALS — BP 112/78 | HR 97 | Ht 70.0 in | Wt 215.1 lb

## 2018-05-08 DIAGNOSIS — Z13220 Encounter for screening for lipoid disorders: Secondary | ICD-10-CM

## 2018-05-08 DIAGNOSIS — R079 Chest pain, unspecified: Secondary | ICD-10-CM

## 2018-05-08 DIAGNOSIS — R12 Heartburn: Secondary | ICD-10-CM

## 2018-05-08 MED ORDER — METOPROLOL TARTRATE 50 MG PO TABS: ORAL_TABLET | ORAL | 0 refills | Status: DC

## 2018-05-08 MED ORDER — PANTOPRAZOLE SODIUM 40 MG PO TBEC: 40.0000 mg | DELAYED_RELEASE_TABLET | Freq: Every day | ORAL | 1 refills | Status: DC

## 2018-05-08 MED ORDER — NITROGLYCERIN 0.4 MG SL SUBL: 0.4000 mg | SUBLINGUAL_TABLET | SUBLINGUAL | 1 refills | Status: DC | PRN

## 2018-05-08 NOTE — Patient Instructions (Addendum)
Medication Instructions:  Your physician has recommended you make the following change in your medication:   START taking protonix 40 mg (1 tablet) once daily START taking Nitroglycerin as needed for chest pain When having chest pain, stop what you are doing and sit down. Take 1 nitro, wait 5 minutes. Still having chest pain, take 1 nitro, wait 5 minutes. Still having chest pain, take 1 nitro, dial 911. Total of 3 nitro in 15 minutes.  If you need a refill on your cardiac medications before your next appointment, please call your pharmacy.   Lab work: Your physician recommends that you have a CMP and lipid panel drawn today.  If you have labs (blood work) drawn today and your tests are completely normal, you will receive your results only by: Marland Kitchen MyChart Message (if you have MyChart) OR . A paper copy in the mail If you have any lab test that is abnormal or we need to change your treatment, we will call you to review the results.  Testing/Procedures: An EKG was performed today.  Your physician has requested that you have cardiac CT. Cardiac computed tomography (CT) is a painless test that uses an x-ray machine to take clear, detailed pictures of your heart. For further information please visit [email or web address] Please follow instruction sheet as given.  Please arrive at the [facility] main entrance of [facility] at xx:xx AM (30-45 minutes prior to test start time)  [facility] [address] [place], [place] [address] [phone]  Proceed to the [facility] Radiology Department (First Floor).  Please follow these instructions carefully (unless otherwise directed):  Hold all erectile dysfunction medications at least 48 hours prior to test.  On the Night Before the Test: . Be sure to Drink plenty of water. . Do not consume any caffeinated/decaffeinated beverages or chocolate 12 hours prior to your test. . Do not take any antihistamines 12 hours prior to  your test.   On the Day of the Test: . Drink plenty of water. Do not drink any water within one hour of the test. . Do not eat any food 4 hours prior to the test. . You may take your regular medications prior to the test.  . Take metoprolol (Lopressor) two hours prior to test.                    -If HR is less than 55 BPM- No Beta Blocker                -IF HR is greater than 55 BPM and patient is less than or equal to 70 yrs old Lopressor 100mg  x1.      After the Test: . Drink plenty of water. . After receiving IV contrast, you may experience a mild flushed feeling. This is normal. . On occasion, you may experience a mild rash up to 24 hours after the test. This is not dangerous. If this occurs, you can take Benadryl 25 mg and increase your fluid intake. . If you experience trouble breathing, this can be serious. If it is severe call 911 IMMEDIATELY. If it is mild, please call our office. . If you take any of these medications: Glipizide/Metformin, Avandament, Glucavance, please do not take 48 hours after completing test.    Follow-Up: At [facility], you and your health needs are our priority.  As part of our continuing mission to provide you with exceptional heart care, we have created designated Provider Care Teams.  These Care Teams include your primary Cardiologist (physician) and Advanced Practice Providers (APPs -  Physician Assistants and Nurse Practitioners) who all work together to provide you with the care you need, when you need it. You will need a follow up appointment in 6 weeks.  Please call our office 2 months in advance to schedule this appointment.  You may see No primary care provider on file.    Any Other Special Instructions Will Be Listed Below   Cardiac CT Angiogram  A cardiac CT angiogram is a procedure to look at the heart and the area around the heart. It may be done to help find the cause of chest pains or other symptoms of heart disease. During this  procedure, a large X-ray machine, called a CT scanner, takes detailed pictures of the heart and the surrounding area after a dye (contrast material) has been injected into blood vessels in the area. The procedure is also sometimes called a coronary CT angiogram, coronary artery scanning, or CTA. A cardiac CT angiogram allows the health care provider to see how well blood is flowing to and from the heart. The health care provider will be able to see if there are any problems, such as:  Blockage or narrowing of the coronary arteries in the heart.  Fluid around the heart.  Signs of weakness or disease in the muscles, valves, and tissues of the heart. Tell a health care provider about:  Any allergies you have. This is especially important if you have had a previous allergic reaction to contrast dye.  All medicines you are taking, including vitamins, herbs, eye drops, creams, and over-the-counter medicines.  Any blood disorders you have.  Any surgeries you have had.  Any medical conditions you have.  Whether you are pregnant or may be pregnant.  Any anxiety disorders, chronic pain, or other conditions you have that may increase your stress or prevent you from lying still. What are the risks? Generally, this is a safe procedure. However, problems may occur, including:  Bleeding.  Infection.  Allergic reactions to medicines or dyes.  Damage to other structures or organs.  Kidney damage from the dye or contrast that is used.  Increased risk of cancer from radiation exposure. This risk is low. Talk with your health care provider about: ? The risks and benefits of testing. ? How you can receive the lowest dose of radiation. What happens before the procedure?  Wear comfortable clothing and remove any jewelry, glasses, dentures, and hearing aids.  Follow instructions from your health care provider about eating and drinking. This may include: ? For 12 hours before the test - avoid  caffeine. This includes tea, coffee, soda, energy drinks, and diet pills. Drink plenty of water or other fluids that do not have caffeine in them. Being well-hydrated can prevent complications. ? For 4-6 hours before the test - stop eating and drinking. The contrast dye can cause nausea, but this is less likely if your stomach is empty.  Ask your health care provider about changing or stopping your regular medicines. This is especially important if you are taking diabetes medicines, blood thinners, or medicines to treat erectile dysfunction. What happens during the procedure?  Hair on your chest may need to be removed so that small sticky patches called electrodes can be placed on your chest. These will transmit information that helps to monitor your heart during the test.  An IV tube will be inserted into one of your veins.  You might be  given a medicine to control your heart rate during the test. This will help to ensure that good images are obtained.  You will be asked to lie on an exam table. This table will slide in and out of the CT machine during the procedure.  Contrast dye will be injected into the IV tube. You might feel warm, or you may get a metallic taste in your mouth.  You will be given a medicine (nitroglycerin) to relax (dilate) the arteries in your heart.  The table that you are lying on will move into the CT machine tunnel for the scan.  The person running the machine will give you instructions while the scans are being done. You may be asked to: ? Keep your arms above your head. ? Hold your breath. ? Stay very still, even if the table is moving.  When the scanning is complete, you will be moved out of the machine.  The IV tube will be removed. The procedure may vary among health care providers and hospitals. What happens after the procedure?  You might feel warm, or you may get a metallic taste in your mouth from the contrast dye.  You may have a headache from the  nitroglycerin.  After the procedure, drink water or other fluids to wash (flush) the contrast material out of your body.  Contact a health care provider if you have any symptoms of allergy to the contrast. These symptoms include: ? Shortness of breath. ? Rash or hives. ? A racing heartbeat.  Most people can return to their normal activities right after the procedure. Ask your health care provider what activities are safe for you.  It is up to you to get the results of your procedure. Ask your health care provider, or the department that is doing the procedure, when your results will be ready. Summary  A cardiac CT angiogram is a procedure to look at the heart and the area around the heart. It may be done to help find the cause of chest pains or other symptoms of heart disease.  During this procedure, a large X-ray machine, called a CT scanner, takes detailed pictures of the heart and the surrounding area after a dye (contrast material) has been injected into blood vessels in the area.  Ask your health care provider about changing or stopping your regular medicines before the procedure. This is especially important if you are taking diabetes medicines, blood thinners, or medicines to treat erectile dysfunction.  After the procedure, drink water or other fluids to wash (flush) the contrast material out of your body. This information is not intended to replace advice given to you by your health care provider. Make sure you discuss any questions you have with your health care provider. Document Released: 02/11/2008 Document Revised: 01/18/2016 Document Reviewed: 01/18/2016 Elsevier Interactive Patient Education  2019 [employer].

## 2018-05-09 ENCOUNTER — Telehealth: Payer: Self-pay | Admitting: *Deleted

## 2018-05-09 LAB — LIPID PANEL
CHOLESTEROL TOTAL: 253 mg/dL — AB (ref 100–199)
Chol/HDL Ratio: 9 ratio — ABNORMAL HIGH (ref 0.0–5.0)
HDL: 28 mg/dL — ABNORMAL LOW (ref >39)
Triglycerides: 459 mg/dL — ABNORMAL HIGH (ref 0–149)

## 2018-05-09 LAB — COMPREHENSIVE METABOLIC PANEL
ALT: 27 IU/L (ref 0–44)
AST: 18 IU/L (ref 0–40)
Albumin/Globulin Ratio: 1.9 (ref 1.2–2.2)
Albumin: 4.7 g/dL (ref 4.0–5.0)
Alkaline Phosphatase: 81 IU/L (ref 39–117)
BUN/Creatinine Ratio: 13 (ref 9–20)
BUN: 14 mg/dL (ref 6–24)
Bilirubin Total: 0.3 mg/dL (ref 0.0–1.2)
CO2: 19 mmol/L — AB (ref 20–29)
CREATININE: 1.11 mg/dL (ref 0.76–1.27)
Calcium: 9.8 mg/dL (ref 8.7–10.2)
Chloride: 103 mmol/L (ref 96–106)
GFR calc Af Amer: 96 mL/min/{1.73_m2} (ref >59)
GFR calc non Af Amer: 83 mL/min/{1.73_m2} (ref >59)
GLOBULIN, TOTAL: 2.5 g/dL (ref 1.5–4.5)
Glucose: 113 mg/dL — ABNORMAL HIGH (ref 65–99)
Potassium: 4.4 mmol/L (ref 3.5–5.2)
Sodium: 142 mmol/L (ref 134–144)
Total Protein: 7.2 g/dL (ref 6.0–8.5)

## 2018-05-09 NOTE — Telephone Encounter (Signed)
-----   Message from Baldo Daub, MD sent at 05/09/2018  7:53 AM EST ----- Normal or stable result  Lets wait on Ct results and office FU to decide if needs medication, elevated

## 2018-05-09 NOTE — Telephone Encounter (Signed)
Left message on patient's cell phone per DPR to return call to discuss lab results.   

## 2018-05-10 ENCOUNTER — Telehealth: Payer: Self-pay

## 2018-05-10 NOTE — Telephone Encounter (Signed)
RN called patient to advise CTA appt time.Patient states he is aware of scheduled appt time from mychart and has no further questions at this time.

## 2018-05-15 ENCOUNTER — Telehealth (HOSPITAL_COMMUNITY): Payer: Self-pay | Admitting: Emergency Medicine

## 2018-05-15 NOTE — Telephone Encounter (Signed)
Left message on voicemail with name and callback number Bensen Chadderdon RN Navigator Cardiac Imaging [facility] [phone] Office [phone] Cell  

## 2018-05-17 ENCOUNTER — Ambulatory Visit (HOSPITAL_COMMUNITY): Payer: BLUE CROSS/BLUE SHIELD

## 2018-05-17 ENCOUNTER — Ambulatory Visit (HOSPITAL_COMMUNITY)
Admission: RE | Admit: 2018-05-17 | Discharge: 2018-05-17 | Disposition: A | Discharge status: Home / Self Care | Payer: BLUE CROSS/BLUE SHIELD | Source: Ambulatory Visit | Attending: Cardiology | Admitting: Cardiology

## 2018-05-17 DIAGNOSIS — R079 Chest pain, unspecified: Secondary | ICD-10-CM | POA: Insufficient documentation

## 2018-05-17 MED ORDER — NITROGLYCERIN 0.4 MG SL SUBL
0.8000 mg | SUBLINGUAL_TABLET | Freq: Once | SUBLINGUAL | Status: AC
  Administered 2018-05-17: 0.8 mg via SUBLINGUAL
  Filled 2018-05-17: qty 25

## 2018-05-17 MED ORDER — NITROGLYCERIN 0.4 MG SL SUBL
SUBLINGUAL_TABLET | SUBLINGUAL | Status: AC
  Filled 2018-05-17: qty 2

## 2018-05-17 MED ORDER — IOHEXOL 350 MG/ML SOLN
80.0000 mL | Freq: Once | INTRAVENOUS | Status: AC | PRN
  Administered 2018-05-17: 80 mL via INTRAVENOUS

## 2018-05-17 NOTE — Progress Notes (Signed)
Patient ambulatory out of department with steady gait. Denies any complains.

## 2018-05-17 NOTE — Progress Notes (Signed)
Complete. Patient denies any complaints. Patient offered snack and beverage.

## 2018-06-19 ENCOUNTER — Telehealth: Payer: Self-pay | Admitting: Cardiology

## 2018-06-19 NOTE — Telephone Encounter (Signed)
° °  Primary Cardiologist:  [facility]  Patient contacted.  History reviewed.  No symptoms to suggest any unstable cardiac conditions.  Based on discussion, with current pandemic situation, we will be postponing this appointment for [facility] with a plan for f/u in 4-6 wks or sooner if feasible/necessary.  If symptoms change, he has been instructed to contact our office.   Gunnar Fusi A [place]  06/19/2018 9:09 AM         .

## 2018-06-19 NOTE — Telephone Encounter (Signed)
149

## 2018-06-21 ENCOUNTER — Ambulatory Visit: Payer: BLUE CROSS/BLUE SHIELD | Admitting: Cardiology

## 2019-02-20 ENCOUNTER — Ambulatory Visit (INDEPENDENT_AMBULATORY_CARE_PROVIDER_SITE_OTHER): Payer: BC Managed Care – PPO | Admitting: Family Medicine

## 2019-02-20 ENCOUNTER — Encounter: Payer: Self-pay | Admitting: Family Medicine

## 2019-02-20 ENCOUNTER — Other Ambulatory Visit: Payer: Self-pay

## 2019-02-20 VITALS — BP 108/68 | HR 92 | Temp 97.1°F | Ht 70.0 in | Wt 222.4 lb

## 2019-02-20 DIAGNOSIS — Z114 Encounter for screening for human immunodeficiency virus [HIV]: Secondary | ICD-10-CM

## 2019-02-20 DIAGNOSIS — Z23 Encounter for immunization: Secondary | ICD-10-CM

## 2019-02-20 DIAGNOSIS — Z0000 Encounter for general adult medical examination without abnormal findings: Secondary | ICD-10-CM

## 2019-02-20 MED ORDER — PANTOPRAZOLE SODIUM 40 MG PO TBEC: 40.0000 mg | DELAYED_RELEASE_TABLET | Freq: Every day | ORAL | 1 refills | Status: DC

## 2019-02-20 NOTE — Addendum Note (Signed)
Addended by: Sharon Seller B on: 02/20/2019 04:23 PM   Modules accepted: Orders

## 2019-02-20 NOTE — Patient Instructions (Signed)
Give us 2-3 business days to get the results of your labs back.   Keep the diet clean and stay active.  Let us know if you need anything. 

## 2019-02-20 NOTE — Progress Notes (Signed)
Chief Complaint  Well visit  Well Male Tyler Stevens is here for a complete physical.   His last physical was >1 year ago.  Current diet: in general, diet is not healthy.  Current exercise: disc golf, works Architect Weight trend: increasing a little bit Daytime fatigue? No. Seat belt? Yes.    Health maintenance Tetanus- No HIV- No  Past Medical History:  Diagnosis Date  . Chest pain in adult 05/07/2018  . Lateral epicondylitis of left elbow 07/25/2017  . RLS (restless legs syndrome) 07/25/2017     Past Surgical History:  Procedure Laterality Date  . WISDOM TOOTH EXTRACTION      Medications  Protonix 40 mg daily prn   Allergies No Known Allergies  Family History Family History  Problem Relation Age of Onset  . Heart Problems Father   . Arrhythmia Father   . Cancer Neg Hx     Review of Systems: Constitutional: no fevers or chills Eye:  no recent significant change in vision Ear/Nose/Mouth/Throat:  Ears:  no hearing loss Nose/Mouth/Throat:  no complaints of nasal congestion, no sore throat Cardiovascular:  no chest pain Respiratory:  no shortness of breath Gastrointestinal:  no abdominal pain, no change in bowel habits GU:  Male: negative for dysuria, frequency, and incontinence Musculoskeletal/Extremities:  no pain of the joints Integumentary (Skin/Breast):  no abnormal skin lesions reported Neurologic:  no headaches Endocrine: No unexpected weight changes Hematologic/Lymphatic:  no night sweats  Exam BP 108/68 (BP Location: Left Arm, Patient Position: Sitting, Cuff Size: Normal)   Pulse 92   Temp (!) 97.1 F (36.2 C) (Temporal)   Ht '5\' 10"'  (1.778 m)   Wt 222 lb 6 oz (100.9 kg)   SpO2 97%   BMI 31.91 kg/m  General:  well developed, well nourished, in no apparent distress Skin:  no significant moles, warts, or growths Head:  no masses, lesions, or tenderness Eyes:  pupils equal and round, sclera anicteric without injection Ears:  canals without  lesions, TMs shiny without retraction, no obvious effusion, no erythema Nose:  nares patent, septum midline, mucosa normal Throat/Pharynx:  lips and gingiva without lesion; tongue and uvula midline; non-inflamed pharynx; no exudates or postnasal drainage Neck: neck supple without adenopathy, thyromegaly, or masses Lungs:  clear to auscultation, breath sounds equal bilaterally, no respiratory distress Cardio:  regular rate and rhythm, no bruits, no LE edema Abdomen:  abdomen soft, nontender; bowel sounds normal; no masses or organomegaly Rectal: Deferred Musculoskeletal:  symmetrical muscle groups noted without atrophy or deformity Extremities:  no clubbing, cyanosis, or edema, no deformities, no skin discoloration Neuro:  gait normal; deep tendon reflexes normal and symmetric Psych: well oriented with normal range of affect and appropriate judgment/insight  Assessment and Plan  Well adult exam - Plan: CBC, Comp Met (CMET), Lipid Profile  Screening for HIV (human immunodeficiency virus) - Plan: HIV antibody (with reflex)   Well 43 y.o. male. Counseled on diet and exercise. Counseled on risks and benefits of prostate cancer screening with PSA. The patient agrees to forego screening.  Other orders as above. Follow up in 1 year pending the above workup. Lab at convenience, fasting.  The patient voiced understanding and agreement to the plan.  [place], DO 02/20/19 3:59 PM

## 2019-02-25 ENCOUNTER — Other Ambulatory Visit: Payer: Self-pay

## 2019-02-25 ENCOUNTER — Other Ambulatory Visit (INDEPENDENT_AMBULATORY_CARE_PROVIDER_SITE_OTHER): Payer: BC Managed Care – PPO

## 2019-02-25 DIAGNOSIS — Z114 Encounter for screening for human immunodeficiency virus [HIV]: Secondary | ICD-10-CM

## 2019-02-25 DIAGNOSIS — Z0000 Encounter for general adult medical examination without abnormal findings: Secondary | ICD-10-CM

## 2019-02-26 LAB — CBC
HCT: 43.6 % (ref 39.0–52.0)
Hemoglobin: 14.4 g/dL (ref 13.0–17.0)
MCHC: 33 g/dL (ref 30.0–36.0)
MCV: 85 fl (ref 78.0–100.0)
Platelets: 389 10*3/uL (ref 150.0–400.0)
RBC: 5.13 Mil/uL (ref 4.22–5.81)
RDW: 13.4 % (ref 11.5–15.5)
WBC: 9.4 10*3/uL (ref 4.0–10.5)

## 2019-02-26 LAB — LIPID PANEL
Cholesterol: 229 mg/dL — ABNORMAL HIGH (ref 0–200)
HDL: 29.2 mg/dL — ABNORMAL LOW (ref >39.00)
NonHDL: 200.18
Total CHOL/HDL Ratio: 8
Triglycerides: 358 mg/dL — ABNORMAL HIGH (ref 0.0–149.0)
VLDL: 71.6 mg/dL — ABNORMAL HIGH (ref 0.0–40.0)

## 2019-02-26 LAB — COMPREHENSIVE METABOLIC PANEL
ALT: 28 U/L (ref 0–53)
AST: 23 U/L (ref 0–37)
Albumin: 4.5 g/dL (ref 3.5–5.2)
Alkaline Phosphatase: 66 U/L (ref 39–117)
BUN: 14 mg/dL (ref 6–23)
CO2: 26 mEq/L (ref 19–32)
Calcium: 9.6 mg/dL (ref 8.4–10.5)
Chloride: 102 mEq/L (ref 96–112)
Creatinine, Ser: 1.12 mg/dL (ref 0.40–1.50)
GFR: 72.26 mL/min (ref >60.00)
Glucose, Bld: 92 mg/dL (ref 70–99)
Potassium: 4.4 mEq/L (ref 3.5–5.1)
Sodium: 138 mEq/L (ref 135–145)
Total Bilirubin: 0.5 mg/dL (ref 0.2–1.2)
Total Protein: 7 g/dL (ref 6.0–8.3)

## 2019-02-26 LAB — LDL CHOLESTEROL, DIRECT: Direct LDL: 121 mg/dL

## 2019-02-26 LAB — HIV ANTIBODY (ROUTINE TESTING W REFLEX): HIV 1&2 Ab, 4th Generation: NONREACTIVE

## 2019-12-02 DIAGNOSIS — S61312A Laceration without foreign body of right middle finger with damage to nail, initial encounter: Secondary | ICD-10-CM | POA: Diagnosis not present

## 2019-12-02 DIAGNOSIS — M79644 Pain in right finger(s): Secondary | ICD-10-CM | POA: Diagnosis not present

## 2019-12-02 DIAGNOSIS — S62639B Displaced fracture of distal phalanx of unspecified finger, initial encounter for open fracture: Secondary | ICD-10-CM | POA: Diagnosis not present

## 2019-12-02 DIAGNOSIS — T1490XA Injury, unspecified, initial encounter: Secondary | ICD-10-CM | POA: Diagnosis not present

## 2019-12-02 DIAGNOSIS — M7989 Other specified soft tissue disorders: Secondary | ICD-10-CM | POA: Diagnosis not present

## 2019-12-02 DIAGNOSIS — S6991XA Unspecified injury of right wrist, hand and finger(s), initial encounter: Secondary | ICD-10-CM | POA: Diagnosis not present

## 2019-12-04 DIAGNOSIS — S61312A Laceration without foreign body of right middle finger with damage to nail, initial encounter: Secondary | ICD-10-CM | POA: Diagnosis not present

## 2019-12-11 DIAGNOSIS — S61312A Laceration without foreign body of right middle finger with damage to nail, initial encounter: Secondary | ICD-10-CM | POA: Diagnosis not present

## 2020-03-04 MED ORDER — PANTOPRAZOLE SODIUM 40 MG PO TBEC: 40.0000 mg | DELAYED_RELEASE_TABLET | Freq: Every day | ORAL | 0 refills | Status: DC

## 2020-05-11 ENCOUNTER — Other Ambulatory Visit: Payer: Self-pay

## 2020-05-12 MED ORDER — PANTOPRAZOLE SODIUM 40 MG PO TBEC: 40.0000 mg | DELAYED_RELEASE_TABLET | Freq: Every day | ORAL | 0 refills | Status: DC

## 2020-07-01 ENCOUNTER — Encounter: Payer: BC Managed Care – PPO | Admitting: Family Medicine

## 2020-07-24 ENCOUNTER — Encounter: Payer: BC Managed Care – PPO | Admitting: Family Medicine

## 2020-08-05 ENCOUNTER — Encounter: Payer: Self-pay | Admitting: Family Medicine

## 2020-08-05 ENCOUNTER — Ambulatory Visit (INDEPENDENT_AMBULATORY_CARE_PROVIDER_SITE_OTHER): Payer: BC Managed Care – PPO | Admitting: Family Medicine

## 2020-08-05 ENCOUNTER — Other Ambulatory Visit: Payer: Self-pay

## 2020-08-05 VITALS — BP 110/70 | HR 83 | Temp 98.2°F | Ht 70.5 in | Wt 218.5 lb

## 2020-08-05 DIAGNOSIS — Z0000 Encounter for general adult medical examination without abnormal findings: Secondary | ICD-10-CM | POA: Diagnosis not present

## 2020-08-05 DIAGNOSIS — G2581 Restless legs syndrome: Secondary | ICD-10-CM

## 2020-08-05 DIAGNOSIS — Z1159 Encounter for screening for other viral diseases: Secondary | ICD-10-CM

## 2020-08-05 MED ORDER — PANTOPRAZOLE SODIUM 40 MG PO TBEC: 40.0000 mg | DELAYED_RELEASE_TABLET | Freq: Every day | ORAL | 2 refills | Status: DC

## 2020-08-05 MED ORDER — PRAMIPEXOLE DIHYDROCHLORIDE 0.5 MG PO TABS: 0.5000 mg | ORAL_TABLET | Freq: Every evening | ORAL | 2 refills | Status: DC

## 2020-08-05 NOTE — Progress Notes (Signed)
Chief Complaint  Patient presents with  . Annual Exam    Well Male Tyler Stevens is here for a complete physical.   His last physical was >1 year ago.  Current diet: in general, a "healthy" diet.   Current exercise: disc golf Weight trend: stable Fatigue out of ordinary? No. Seat belt? Yes.    Health maintenance Tetanus- Yes HIV- Yes Hep C- No   Our last The patient has a several year history of RLS.  He had negative iron studies done in 2018.  He was placed on 0.5 mg of Requip in the past that did not help.  He is currently not taking anything.  Symptoms do tend to be worse when he is sitting down and at nighttime.  He is interested in trying something else.  Past Medical History:  Diagnosis Date  . Chest pain in adult 05/07/2018  . Lateral epicondylitis of left elbow 07/25/2017  . RLS (restless legs syndrome) 07/25/2017     Past Surgical History:  Procedure Laterality Date  . WISDOM TOOTH EXTRACTION      Medications  Current Outpatient Medications on File Prior to Visit  Medication Sig Dispense Refill  . pantoprazole (PROTONIX) 40 MG tablet Take 1 tablet (40 mg total) by mouth daily. 90 tablet 0    Allergies No Known Allergies  Family History Family History  Problem Relation Age of Onset  . Heart Problems Father   . Arrhythmia Father   . Cancer Neg Hx     Review of Systems: Constitutional: no fevers or chills Eye:  no recent significant change in vision Ear/Nose/Mouth/Throat:  Ears:  no hearing loss Nose/Mouth/Throat:  no complaints of nasal congestion, no sore throat Cardiovascular:  no chest pain Respiratory:  no shortness of breath Gastrointestinal:  no abdominal pain, no change in bowel habits GU:  Male: negative for dysuria, frequency, and incontinence Musculoskeletal/Extremities:  no pain of the joints Integumentary (Skin/Breast):  no abnormal skin lesions reported Neurologic: + Shaking of legs Endocrine: No unexpected weight  changes Hematologic/Lymphatic:  no night sweats  Exam BP 110/70 (BP Location: Left Arm, Patient Position: Sitting, Cuff Size: Normal)   Pulse 83   Temp 98.2 F (36.8 C) (Oral)   Ht 5' 10.5" (1.791 m)   Wt 218 lb 8 oz (99.1 kg)   SpO2 96%   BMI 30.91 kg/m  General:  well developed, well nourished, in no apparent distress Skin:  no significant moles, warts, or growths Head:  no masses, lesions, or tenderness Eyes:  pupils equal and round, sclera anicteric without injection Ears:  canals without lesions, TMs shiny without retraction, no obvious effusion, no erythema Nose:  nares patent, septum midline, mucosa normal Throat/Pharynx:  lips and gingiva without lesion; tongue and uvula midline; non-inflamed pharynx; no exudates or postnasal drainage Neck: neck supple without adenopathy, thyromegaly, or masses Lungs:  clear to auscultation, breath sounds equal bilaterally, no respiratory distress Cardio:  regular rate and rhythm, no bruits, no LE edema Abdomen:  abdomen soft, nontender; bowel sounds normal; no masses or organomegaly Rectal: Deferred Musculoskeletal:  symmetrical muscle groups noted without atrophy or deformity Extremities:  no clubbing, cyanosis, or edema, no deformities, no skin discoloration Neuro:  gait normal; deep tendon reflexes normal and symmetric Psych: well oriented with normal range of affect and appropriate judgment/insight  Assessment and Plan  Well adult exam   Well 45 y.o. male. Counseled on diet and exercise. Counseled on risks and benefits with PSA. The patient agrees to forego screening.  Other orders as above. RLS: Chronic, uncontrolled.  Start Mirapex 0.25 mg nightly.  Do not fill and let me know if too expensive.  Could consider gabapentin.  We will check CBC when he is fasting, if suggestive of iron deficiency, will redraw those labs. Follow up in 1 month pending the above workup. The patient voiced understanding and agreement to the  plan.  Jilda Roche [place], DO 08/05/20 3:39 PM

## 2020-08-05 NOTE — Patient Instructions (Signed)
Give us 2-3 business days to get the results of your labs back.   Keep the diet clean and stay active.  Let us know if you need anything. 

## 2020-08-19 ENCOUNTER — Other Ambulatory Visit (INDEPENDENT_AMBULATORY_CARE_PROVIDER_SITE_OTHER): Payer: BC Managed Care – PPO

## 2020-08-19 ENCOUNTER — Other Ambulatory Visit: Payer: Self-pay

## 2020-08-19 DIAGNOSIS — Z1159 Encounter for screening for other viral diseases: Secondary | ICD-10-CM | POA: Diagnosis not present

## 2020-08-19 DIAGNOSIS — Z0000 Encounter for general adult medical examination without abnormal findings: Secondary | ICD-10-CM

## 2020-08-20 LAB — COMPREHENSIVE METABOLIC PANEL
ALT: 28 U/L (ref 0–53)
AST: 22 U/L (ref 0–37)
Albumin: 4.6 g/dL (ref 3.5–5.2)
Alkaline Phosphatase: 68 U/L (ref 39–117)
BUN: 13 mg/dL (ref 6–23)
CO2: 26 mEq/L (ref 19–32)
Calcium: 9.6 mg/dL (ref 8.4–10.5)
Chloride: 102 mEq/L (ref 96–112)
Creatinine, Ser: 1.05 mg/dL (ref 0.40–1.50)
GFR: 87.59 mL/min (ref >60.00)
Glucose, Bld: 90 mg/dL (ref 70–99)
Potassium: 4.2 mEq/L (ref 3.5–5.1)
Sodium: 138 mEq/L (ref 135–145)
Total Bilirubin: 0.7 mg/dL (ref 0.2–1.2)
Total Protein: 7.3 g/dL (ref 6.0–8.3)

## 2020-08-20 LAB — HEPATITIS C ANTIBODY
Hepatitis C Ab: NONREACTIVE
SIGNAL TO CUT-OFF: 0.01 (ref <1.00)

## 2020-08-20 LAB — CBC
HCT: 44.1 % (ref 39.0–52.0)
Hemoglobin: 14.4 g/dL (ref 13.0–17.0)
MCHC: 32.7 g/dL (ref 30.0–36.0)
MCV: 83.9 fl (ref 78.0–100.0)
Platelets: 453 10*3/uL — ABNORMAL HIGH (ref 150.0–400.0)
RBC: 5.26 Mil/uL (ref 4.22–5.81)
RDW: 13.3 % (ref 11.5–15.5)
WBC: 9.7 10*3/uL (ref 4.0–10.5)

## 2020-08-20 LAB — LIPID PANEL
Cholesterol: 229 mg/dL — ABNORMAL HIGH (ref 0–200)
HDL: 34.7 mg/dL — ABNORMAL LOW (ref >39.00)
LDL Cholesterol: 160 mg/dL — ABNORMAL HIGH (ref 0–99)
NonHDL: 194.5
Total CHOL/HDL Ratio: 7
Triglycerides: 171 mg/dL — ABNORMAL HIGH (ref 0.0–149.0)
VLDL: 34.2 mg/dL (ref 0.0–40.0)

## 2020-09-09 ENCOUNTER — Encounter: Payer: Self-pay | Admitting: Family Medicine

## 2020-09-09 ENCOUNTER — Ambulatory Visit (INDEPENDENT_AMBULATORY_CARE_PROVIDER_SITE_OTHER): Payer: BC Managed Care – PPO | Admitting: Family Medicine

## 2020-09-09 ENCOUNTER — Other Ambulatory Visit: Payer: Self-pay

## 2020-09-09 VITALS — BP 104/62 | HR 80 | Temp 98.4°F | Ht 70.5 in | Wt 217.5 lb

## 2020-09-09 DIAGNOSIS — E782 Mixed hyperlipidemia: Secondary | ICD-10-CM | POA: Insufficient documentation

## 2020-09-09 DIAGNOSIS — G2581 Restless legs syndrome: Secondary | ICD-10-CM

## 2020-09-09 MED ORDER — ROSUVASTATIN CALCIUM 20 MG PO TABS: 20.0000 mg | ORAL_TABLET | Freq: Every day | ORAL | 3 refills | Status: DC

## 2020-09-09 NOTE — Progress Notes (Signed)
Chief Complaint  Patient presents with   Foot Injury    Subjective: Patient is a 45 y.o. male here for f/u RLS.  Started on Mirapex 0.5 mg qhs. Compliant, no AE's, doing well on the medication. No complaints.  Hyperlipidemia Patient presents for mixed hyperlipidemia follow up. Currently being treated with None He is adhering to a healthy diet. Exercise: walking The patient is not known to have coexisting coronary artery disease.  Past Medical History:  Diagnosis Date   Chest pain in adult 05/07/2018   Lateral epicondylitis of left elbow 07/25/2017   RLS (restless legs syndrome) 07/25/2017    Objective: BP 104/62   Pulse 80   Temp 98.4 F (36.9 C) (Oral)   Ht 5' 10.5" (1.791 m)   Wt 217 lb 8 oz (98.7 kg)   SpO2 97%   BMI 30.77 kg/m  General: Awake, appears stated age Heart: RRR Lungs: CTAB, no rales, wheezes or rhonchi. No accessory muscle use Neuro: 5/5 strength in LE's b/l, DTR's equal and symmetric throughout, no clonus, no cerebellar signs Psych: Age appropriate judgment and insight, normal affect and mood  Assessment and Plan: RLS (restless legs syndrome)  Mixed hyperlipidemia - Plan: Lipid panel, Comprehensive metabolic panel  Chronic, stable. Cont Mirapex 0.5 mg qhs.  Chronic, new/unstable. Start Crestor 20 mg/d. Reck labs in 6 weeks as above. Counseled on diet/exercise. I think he may be too young for a good result from Coronary Ca scoring.  F/u in 6 mo for med ck.  The patient voiced understanding and agreement to the plan.  Jilda Roche [place], DO 09/09/20  3:35 PM

## 2020-09-09 NOTE — Patient Instructions (Signed)
Keep the diet clean and stay active.  Let us know if you need anything. 

## 2020-10-21 ENCOUNTER — Other Ambulatory Visit: Payer: BC Managed Care – PPO

## 2020-10-21 NOTE — Addendum Note (Signed)
Addended by: Mervin Kung A on: 10/21/2020 08:06 AM   Modules accepted: Orders

## 2020-10-22 ENCOUNTER — Other Ambulatory Visit (INDEPENDENT_AMBULATORY_CARE_PROVIDER_SITE_OTHER): Payer: BC Managed Care – PPO

## 2020-10-22 ENCOUNTER — Other Ambulatory Visit: Payer: Self-pay

## 2020-10-22 DIAGNOSIS — E782 Mixed hyperlipidemia: Secondary | ICD-10-CM

## 2020-10-22 LAB — COMPREHENSIVE METABOLIC PANEL
ALT: 28 U/L (ref 0–53)
AST: 23 U/L (ref 0–37)
Albumin: 4.6 g/dL (ref 3.5–5.2)
Alkaline Phosphatase: 73 U/L (ref 39–117)
BUN: 15 mg/dL (ref 6–23)
CO2: 26 mEq/L (ref 19–32)
Calcium: 9.5 mg/dL (ref 8.4–10.5)
Chloride: 104 mEq/L (ref 96–112)
Creatinine, Ser: 1.04 mg/dL (ref 0.40–1.50)
GFR: 88.49 mL/min (ref >60.00)
Glucose, Bld: 117 mg/dL — ABNORMAL HIGH (ref 70–99)
Potassium: 4.4 mEq/L (ref 3.5–5.1)
Sodium: 139 mEq/L (ref 135–145)
Total Bilirubin: 0.6 mg/dL (ref 0.2–1.2)
Total Protein: 7.1 g/dL (ref 6.0–8.3)

## 2020-10-22 LAB — LIPID PANEL
Cholesterol: 136 mg/dL (ref 0–200)
HDL: 33.4 mg/dL — ABNORMAL LOW (ref >39.00)
LDL Cholesterol: 67 mg/dL (ref 0–99)
NonHDL: 102.21
Total CHOL/HDL Ratio: 4
Triglycerides: 176 mg/dL — ABNORMAL HIGH (ref 0.0–149.0)
VLDL: 35.2 mg/dL (ref 0.0–40.0)

## 2020-12-01 IMAGING — CT CT HEART MORP W/ CTA COR W/ SCORE W/ CA W/CM &/OR W/O CM
2 of 8 series · 5 of 20 positions shown, 6 images · IV contrast (APPLIED)
Comparison: None.
COMPARISON: None.
COMPARISON: None.

Addendum:
EXAM:
OVER-READ INTERPRETATION  CT CHEST

The following report is an over-read performed by radiologist Dr.
Maria Magdalena Hallberg [REDACTED] on 05/17/2018. This
over-read does not include interpretation of cardiac or coronary
anatomy or pathology. The coronary calcium score/coronary CTA
interpretation by the cardiologist is attached.
CLINICAL DATA: 42M with atypical chest pain.
Cardiac/Coronary  CT
TECHNIQUE: The patient was scanned on a [employer] Force scanner.

[Series 6: best diast 77 % · axial · 0.36mm/px · z∈[+1166,+1210]mm · 2 of 331 slices shown]
[im 111/331  vessel]
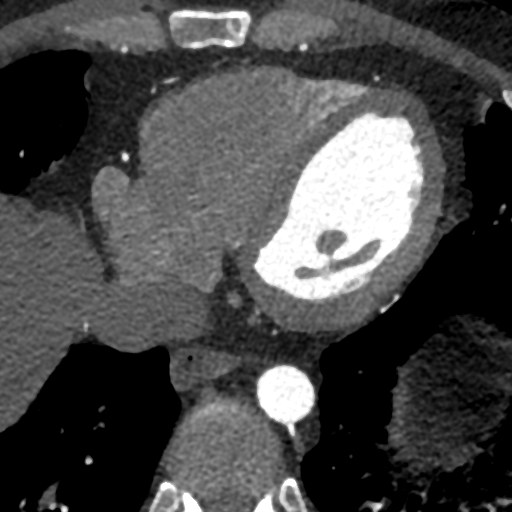
[im 221/331  vessel]
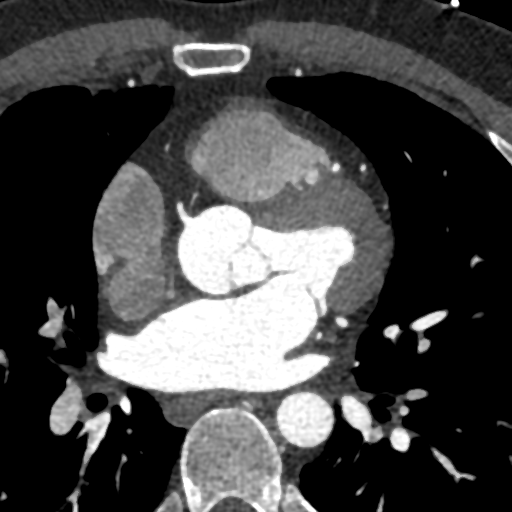

[Series 13: (id) · axial · 0.36mm/px · z∈[+1122,+1254]mm · 3 of 331 slices shown, 4 images]
[im 1/331  vessel]
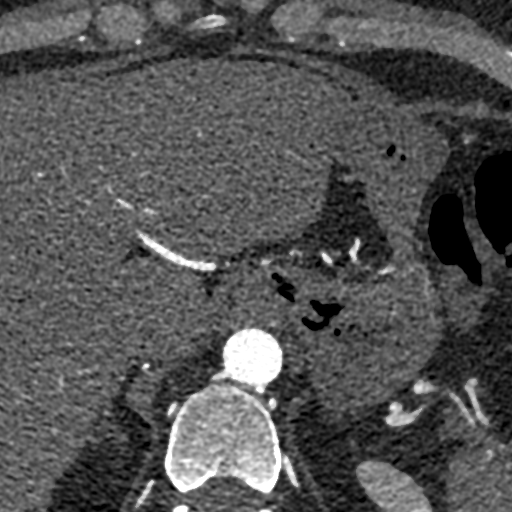
[im 1/331  lung]
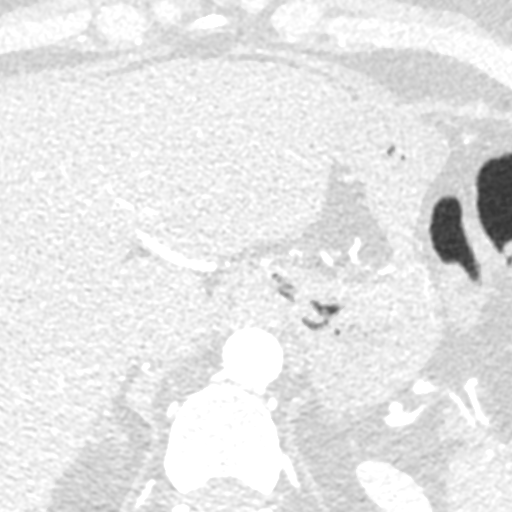
[im 166/331  vessel]
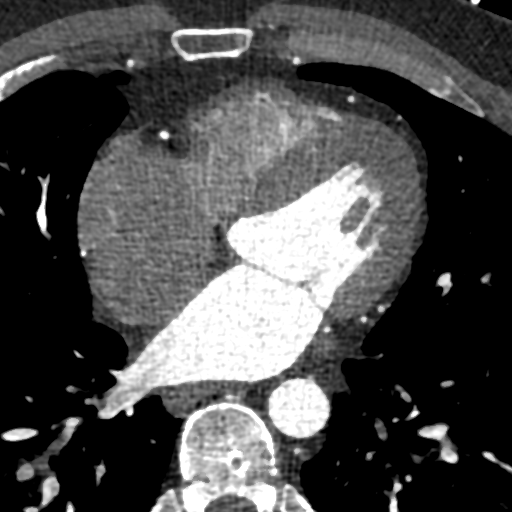
[im 331/331  vessel]
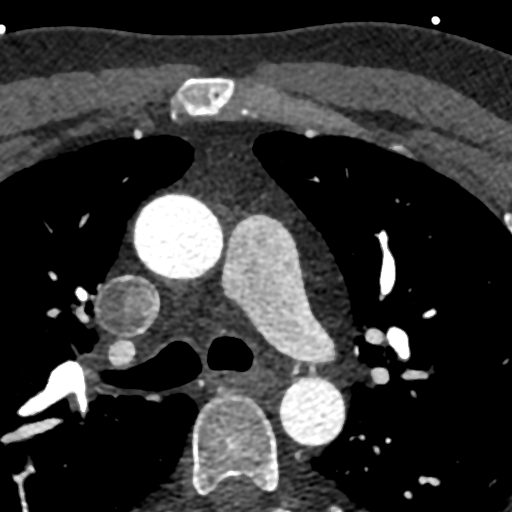

[5 of 20 positions shown; findings below may reference images not displayed]

FINDINGS: 2 mm right middle lobe pulmonary nodule (axial image 25 of series
11), nonspecific. Areas of dependent atelectasis are noted in the
lower lobes of the lungs bilaterally. Within the visualized portions
of the thorax there are no other larger more suspicious appearing
pulmonary nodules or masses, there is no acute consolidative
airspace disease, no pleural effusions, no pneumothorax and no
lymphadenopathy. Visualized portions of the upper abdomen are
unremarkable. There are no aggressive appearing lytic or blastic
lesions noted in the visualized portions of the skeleton.
IMPRESSION: 1. 2 mm right middle lobe pulmonary nodule, nonspecific, but
statistically benign. No follow-up needed if patient is low-risk.
Non-contrast chest CT can be considered in 12 months if patient is
high-risk. This recommendation follows the consensus statement:
Guidelines for Management of Incidental Pulmonary Nodules Detected
FINDINGS: A 120 kV prospective scan was triggered in the descending thoracic
aorta at 111 HU's. Axial non-contrast 3 mm slices were carried out
through the heart. The data set was analyzed on a dedicated work
station and scored using the Agatson method. Gantry rotation speed
was 250 msecs and collimation was .6 mm. No beta blockade and 0.8 mg
of sl NTG was given. The 3D data set was reconstructed in 5%
intervals of the 67-82 % of the R-R cycle. Diastolic phases were
analyzed on a dedicated work station using MPR, MIP and VRT modes.
The patient received 80 cc of contrast.

Aorta: Normal size. Ascending aorta 2.8 cm. No calcifications. No
dissection.

Aortic Valve:  Trileaflet.  No calcifications.

Coronary Arteries:  Normal coronary origin.  Right dominance.

RCA is a large dominant artery that gives rise to PDA and PLVB.
There is no plaque.

Left main is a large artery that gives rise to LAD and LCX arteries.

LAD is a large vessel that has no plaque. There is a small D1, small
D2 and large D3 without plaque.

LCX is a non-dominant artery that gives rise to one large branching
OM1 and small OM2 and OM3. There is no plaque.

Other findings:

Normal pulmonary vein drainage into the left atrium.

Normal let atrial appendage without a thrombus.

Normal size of the pulmonary artery.
IMPRESSION: 1. Coronary calcium score of 0. This was 0 percentile for age and
sex matched control.

2. Normal coronary origin with right dominance.

3. No evidence of CAD.

*** End of Addendum ***
Addendum:
EXAM:
OVER-READ INTERPRETATION  CT CHEST

The following report is an over-read performed by radiologist Dr.
Maria Magdalena Hallberg [REDACTED] on 05/17/2018. This
over-read does not include interpretation of cardiac or coronary
anatomy or pathology. The coronary calcium score/coronary CTA
interpretation by the cardiologist is attached.
FINDINGS: 2 mm right middle lobe pulmonary nodule (axial image 25 of series
11), nonspecific. Areas of dependent atelectasis are noted in the
lower lobes of the lungs bilaterally. Within the visualized portions
of the thorax there are no other larger more suspicious appearing
pulmonary nodules or masses, there is no acute consolidative
airspace disease, no pleural effusions, no pneumothorax and no
lymphadenopathy. Visualized portions of the upper abdomen are
unremarkable. There are no aggressive appearing lytic or blastic
lesions noted in the visualized portions of the skeleton.
IMPRESSION: 1. 2 mm right middle lobe pulmonary nodule, nonspecific, but
statistically benign. No follow-up needed if patient is low-risk.
Non-contrast chest CT can be considered in 12 months if patient is
high-risk. This recommendation follows the consensus statement:
Guidelines for Management of Incidental Pulmonary Nodules Detected
FINDINGS: A 120 kV prospective scan was triggered in the descending thoracic
aorta at 111 HU's. Axial non-contrast 3 mm slices were carried out
through the heart. The data set was analyzed on a dedicated work
station and scored using the Agatson method. Gantry rotation speed
was 250 msecs and collimation was .6 mm. No beta blockade and 0.8 mg
of sl NTG was given. The 3D data set was reconstructed in 5%
intervals of the 67-82 % of the R-R cycle. Diastolic phases were
analyzed on a dedicated work station using MPR, MIP and VRT modes.
The patient received 80 cc of contrast.

Aorta: Normal size. Ascending aorta 2.8 cm. No calcifications. No
dissection.

Aortic Valve:  Trileaflet.  No calcifications.

Coronary Arteries:  Normal coronary origin.  Right dominance.

RCA is a large dominant artery that gives rise to PDA and PLVB.
There is no plaque.

Left main is a large artery that gives rise to LAD and LCX arteries.

LAD is a large vessel that has no plaque. There is a small D1, small
D2 and large D3 without plaque.

LCX is a non-dominant artery that gives rise to one large branching
OM1 and small OM2 and OM3. There is no plaque.

Other findings:

Normal pulmonary vein drainage into the left atrium.

Normal let atrial appendage without a thrombus.

Normal size of the pulmonary artery.
IMPRESSION: 1. Coronary calcium score of 0. This was 0 percentile for age and
sex matched control.

2. Normal coronary origin with right dominance.

3. No evidence of CAD.

*** End of Addendum ***
EXAM:
OVER-READ INTERPRETATION  CT CHEST

The following report is an over-read performed by radiologist Dr.
Maria Magdalena Hallberg [REDACTED] on 05/17/2018. This
over-read does not include interpretation of cardiac or coronary
anatomy or pathology. The coronary calcium score/coronary CTA
interpretation by the cardiologist is attached.
FINDINGS: 2 mm right middle lobe pulmonary nodule (axial image 25 of series
11), nonspecific. Areas of dependent atelectasis are noted in the
lower lobes of the lungs bilaterally. Within the visualized portions
of the thorax there are no other larger more suspicious appearing
pulmonary nodules or masses, there is no acute consolidative
airspace disease, no pleural effusions, no pneumothorax and no
lymphadenopathy. Visualized portions of the upper abdomen are
unremarkable. There are no aggressive appearing lytic or blastic
lesions noted in the visualized portions of the skeleton.
IMPRESSION: 1. 2 mm right middle lobe pulmonary nodule, nonspecific, but
statistically benign. No follow-up needed if patient is low-risk.
Non-contrast chest CT can be considered in 12 months if patient is
high-risk. This recommendation follows the consensus statement:
Guidelines for Management of Incidental Pulmonary Nodules Detected

## 2020-12-17 ENCOUNTER — Telehealth: Payer: Self-pay | Admitting: Emergency Medicine

## 2020-12-17 ENCOUNTER — Encounter: Payer: Self-pay | Admitting: Emergency Medicine

## 2020-12-17 ENCOUNTER — Other Ambulatory Visit: Payer: Self-pay

## 2020-12-17 ENCOUNTER — Emergency Department (INDEPENDENT_AMBULATORY_CARE_PROVIDER_SITE_OTHER)
Admission: EM | Admit: 2020-12-17 | Discharge: 2020-12-17 | Disposition: A | Discharge status: Home / Self Care | Payer: BC Managed Care – PPO | Source: Home / Self Care | Attending: Family Medicine | Admitting: Family Medicine

## 2020-12-17 DIAGNOSIS — S61211A Laceration without foreign body of left index finger without damage to nail, initial encounter: Secondary | ICD-10-CM | POA: Diagnosis not present

## 2020-12-17 DIAGNOSIS — R200 Anesthesia of skin: Secondary | ICD-10-CM | POA: Diagnosis not present

## 2020-12-17 DIAGNOSIS — S61213A Laceration without foreign body of left middle finger without damage to nail, initial encounter: Secondary | ICD-10-CM | POA: Diagnosis not present

## 2020-12-17 MED ORDER — ACETAMINOPHEN 325 MG PO TABS
650.0000 mg | ORAL_TABLET | Freq: Once | ORAL | Status: AC
  Administered 2020-12-17: 650 mg via ORAL

## 2020-12-17 NOTE — Telephone Encounter (Signed)
Call to give Tyler Stevens information about follow up appointment tomorrow at [facility] in [place] at 10am w/ Dr Yehuda Budd. Pt was driving when RN called.Connection ws bad on [facility] side - RN will call to confirm [name] has address for appointment. [facility] - [address], [place], [place] [address]. Offic e# [id number]

## 2020-12-17 NOTE — ED Notes (Signed)
Will call patient w/ appointment time at [facility]

## 2020-12-17 NOTE — Telephone Encounter (Signed)
Call back to [facility] regarding appoint time at [facility] at 10am tomorrow. RN gave phone # & address to [facility]. Rminded him to arrive 15 min earlier for appointment. RN also explained how to set up My Chart for [facility]

## 2020-12-17 NOTE — ED Triage Notes (Addendum)
Laceration w/ a razor to left middle & index finger Dressings applied at 3 pm when pt self at home  Wounds not cleaned at home  Tdap 2020 No COVID vaccine

## 2020-12-17 NOTE — Discharge Instructions (Addendum)
I have called Dr. Amanda Pea with [facility].  He is the hand specialist I recommend. They will call with appt.

## 2020-12-17 NOTE — ED Provider Notes (Signed)
[facility]    CSN: [id number] Arrival date & time: 12/17/20  1519      History   Chief Complaint Chief Complaint  Patient presents with   Extremity Laceration    Left middle & index    HPI Tyler Stevens is a 45 y.o. male.   HPI  Patient was working at home.  He was using a new razor knife.  He inadvertently cut his fingers.  She is here for laceration repair.  He has cut the index and long fingers of the left hand.  Bleeding controlled with pressure  Past Medical History:  Diagnosis Date   Chest pain in adult 05/07/2018   Lateral epicondylitis of left elbow 07/25/2017   RLS (restless legs syndrome) 07/25/2017    Patient Active Problem List   Diagnosis Date Noted   Mixed hyperlipidemia 09/09/2020   Chronic heartburn 05/08/2018   Chest pain in adult 05/07/2018   RLS (restless legs syndrome) 07/25/2017   Lateral epicondylitis of left elbow 07/25/2017    Past Surgical History:  Procedure Laterality Date   WISDOM TOOTH EXTRACTION         Home Medications    Prior to Admission medications   Medication Sig Start Date End Date Taking? Authorizing Provider  pantoprazole (PROTONIX) 40 MG tablet Take 1 tablet (40 mg total) by mouth daily. 08/05/20   Sharlene Dory, DO  pramipexole (MIRAPEX) 0.5 MG tablet Take 1 tablet (0.5 mg total) by mouth at bedtime. 08/05/20   Sharlene Dory, DO  rosuvastatin (CRESTOR) 20 MG tablet Take 1 tablet (20 mg total) by mouth daily. 09/09/20   Sharlene Dory, DO    Family History Family History  Problem Relation Age of Onset   Healthy Mother    Heart Problems Father    Arrhythmia Father    Cancer Neg Hx     Social History Social History   Tobacco Use   Smoking status: Former    Packs/day: 1.00    Years: 25.00    Pack years: 25.00    Types: Cigarettes    Quit date: 07/13/2014    Years since quitting: 6.4   Smokeless tobacco: Never  Vaping Use   Vaping Use: Some days   Substances:  Mixture of cannabinoids  Substance Use Topics   Alcohol use: Yes    Comment: very rarely   Drug use: Yes    Types: Marijuana     Allergies   Patient has no known allergies.   Review of Systems Review of Systems See HPI  Physical Exam Triage Vital Signs ED Triage Vitals  Enc Vitals Group     BP 12/17/20 1532 127/83     Pulse Rate 12/17/20 1532 (!) 106     Resp 12/17/20 1532 16     Temp 12/17/20 1532 99.2 F (37.3 C)     Temp Source 12/17/20 1532 Oral     SpO2 12/17/20 1532 96 %     Weight 12/17/20 1535 216 lb 0.8 oz (98 kg)     Height --      Head Circumference --      Peak Flow --      Pain Score 12/17/20 1534 3     Pain Loc --      Pain Edu? --      Excl. in GC? --    No data found.  Updated Vital Signs BP 127/83 (BP Location: Right Arm)   Pulse (!) 106   Temp 99.2 F (  37.3 C) (Oral)   Resp 16   Wt 98 kg   SpO2 96%   BMI 30.56 kg/m   Physical Exam Constitutional:      General: He is not in acute distress.    Appearance: He is well-developed.  HENT:     Head: Normocephalic and atraumatic.  Eyes:     Conjunctiva/sclera: Conjunctivae normal.     Pupils: Pupils are equal, round, and reactive to light.  Cardiovascular:     Rate and Rhythm: Normal rate.  Pulmonary:     Effort: Pulmonary effort is normal. No respiratory distress.  Abdominal:     General: There is no distension.     Palpations: Abdomen is soft.  Musculoskeletal:        General: Normal range of motion.     Cervical back: Normal range of motion.  Skin:    General: Skin is warm and dry.     Comments: Index finger has a laceration along the lateral (radial) edge of the finger that is long and U-shaped.  He does not have sensation at the tip.  The long finger has a curved laceration that is at the radial aspect of the finger over the DIP region.  Sensations are normal.  Flexion and extension are normal.  Neurological:     Mental Status: He is alert.     UC Treatments / Results   Labs (all labs ordered are listed, but only abnormal results are displayed) Labs Reviewed - No data to display  EKG   Radiology No results found.  Procedures Laceration Repair  Date/Time: 12/17/2020 7:13 PM Performed by: Eustace Moore, MD Authorized by: Eustace Moore, MD   Consent:    Consent obtained:  Verbal   Consent given by:  Patient   Risks discussed:  Nerve damage Universal protocol:    Patient identity confirmed:  Verbally with patient and arm band Anesthesia:    Anesthesia method:  Nerve block   Block location:  Index finger   Block needle gauge:  25 G   Block anesthetic:  Lidocaine 2% w/o epi   Block injection procedure:  Anatomic landmarks identified, introduced needle and incremental injection   Block outcome:  Anesthesia achieved Laceration details:    Location:  Finger   Finger location:  L index finger   Length (cm):  3.5   Depth (mm):  15 Pre-procedure details:    Preparation:  Patient was prepped and draped in usual sterile fashion Exploration:    Hemostasis achieved with:  Tourniquet   Wound exploration: wound explored through full range of motion     Wound extent: nerve damage   Treatment:    Area cleansed with:  Chlorhexidine   Amount of cleaning:  Standard   Irrigation solution:  Sterile saline   Irrigation volume:  10   Irrigation method:  Syringe   Debridement:  None Skin repair:    Repair method:  Sutures   Suture size:  5-0   Suture material:  Nylon   Suture technique:  Simple interrupted   Number of sutures:  6 Approximation:    Approximation:  Close Repair type:    Repair type:  Simple Post-procedure details:    Dressing:  Antibiotic ointment and non-adherent dressing   Procedure completion:  Tolerated Comments:     Long finger had a 2 and a Half centimeter laceration has a depth of 1/2 cm.  Local infiltration of anesthesia.  Closed with 3 interrupted sutures. (including critical care time)  Medications Ordered in  UC Medications  acetaminophen (TYLENOL) tablet 650 mg (650 mg Oral Given 12/17/20 1555)    Initial Impression / Assessment and Plan / UC Course  I have reviewed the triage vital signs and the nursing notes.  Pertinent labs & imaging results that were available during my care of the patient were reviewed by me and considered in my medical decision making (see chart for details).    Total laceration length, 6 cm for 2 Final Clinical Impressions(s) / UC Diagnoses   Final diagnoses:  Laceration of left index finger without foreign body without damage to nail, initial encounter  Laceration of left middle finger without foreign body without damage to nail, initial encounter  Finger numbness     Discharge Instructions      I have called Dr. Amanda Pea with [facility].  He is the hand specialist I recommend. They will call with appt.      ED Prescriptions   None    PDMP not reviewed this encounter.   Eustace Moore, MD 12/17/20 1919

## 2020-12-18 DIAGNOSIS — S61412A Laceration without foreign body of left hand, initial encounter: Secondary | ICD-10-CM | POA: Diagnosis not present

## 2021-01-01 DIAGNOSIS — S61412A Laceration without foreign body of left hand, initial encounter: Secondary | ICD-10-CM | POA: Diagnosis not present

## 2021-03-24 ENCOUNTER — Ambulatory Visit (INDEPENDENT_AMBULATORY_CARE_PROVIDER_SITE_OTHER): Payer: BC Managed Care – PPO | Admitting: Family Medicine

## 2021-03-24 ENCOUNTER — Encounter: Payer: Self-pay | Admitting: Family Medicine

## 2021-03-24 VITALS — BP 110/78 | HR 98 | Temp 98.7°F | Ht 70.5 in | Wt 220.2 lb

## 2021-03-24 DIAGNOSIS — K219 Gastro-esophageal reflux disease without esophagitis: Secondary | ICD-10-CM

## 2021-03-24 DIAGNOSIS — G2581 Restless legs syndrome: Secondary | ICD-10-CM

## 2021-03-24 DIAGNOSIS — E782 Mixed hyperlipidemia: Secondary | ICD-10-CM | POA: Diagnosis not present

## 2021-03-24 MED ORDER — ROSUVASTATIN CALCIUM 20 MG PO TABS: 20.0000 mg | ORAL_TABLET | Freq: Every day | ORAL | 3 refills | Status: DC

## 2021-03-24 MED ORDER — PRAMIPEXOLE DIHYDROCHLORIDE 1 MG PO TABS: 1.0000 mg | ORAL_TABLET | Freq: Every evening | ORAL | 2 refills | Status: DC

## 2021-03-24 MED ORDER — PANTOPRAZOLE SODIUM 40 MG PO TBEC: 40.0000 mg | DELAYED_RELEASE_TABLET | Freq: Every day | ORAL | 2 refills | Status: DC

## 2021-03-24 NOTE — Patient Instructions (Addendum)
Keep the diet clean and stay active.  Take 2 tabs of your Mirapex until you run out and a new dosage has been sent in.  Let us know if you need anything.

## 2021-03-24 NOTE — Progress Notes (Signed)
Chief Complaint  Patient presents with   Follow-up    Subjective: Hyperlipidemia Patient presents for Hyperlipidemia follow up. Currently taking Crestor 20 mg/d and compliance with treatment thus far has been good. He denies myalgias. He is adhering to a healthy diet. Exercise: disc golf The patient is not known to have coexisting coronary artery disease.  RLS- taking Mirapex 0.5 mg qhs. Compliant, no AE's. Waking up early in AM w ss's, still helpful to some extent. Failed Requip. Wondering what can be done.   GERD- takes Protonix 40 mg qod. No AE's.   Past Medical History:  Diagnosis Date   Chest pain in adult 05/07/2018   Lateral epicondylitis of left elbow 07/25/2017   RLS (restless legs syndrome) 07/25/2017    Objective: BP 110/78    Pulse 98    Temp 98.7 F (37.1 C) (Oral)    Ht 5' 10.5" (1.791 m)    Wt 220 lb 4 oz (99.9 kg)    SpO2 94%    BMI 31.16 kg/m  General: Awake, appears stated age HEENT: MMM Heart: RRR, no LE edema, no bruits Lungs: CTAB, no rales, wheezes or rhonchi. No accessory muscle use Abd: BS+, NT, ND Psych: Age appropriate judgment and insight, normal affect and mood  Assessment and Plan: Mixed hyperlipidemia  RLS (restless legs syndrome)  Gastroesophageal reflux disease, unspecified whether esophagitis present  Chronic, stable. Cont Crestor 20 mg.d Counseled on diet/exercise. Chronic, unstable. Increase Mirapex from 0.5 mg qhs to 1 mg qhs. Fu in 1 mo if no better. Chronic, stable. Cont Protonix prn.  F/u in 6 mo for CPE or prn. The patient voiced understanding and agreement to the plan.  Jilda Roche [place], DO 03/24/21  4:17 PM

## 2021-04-29 ENCOUNTER — Other Ambulatory Visit: Payer: Self-pay | Admitting: Family Medicine

## 2021-04-29 DIAGNOSIS — Z0000 Encounter for general adult medical examination without abnormal findings: Secondary | ICD-10-CM

## 2021-09-21 ENCOUNTER — Encounter: Payer: Self-pay | Admitting: Family Medicine

## 2021-09-21 ENCOUNTER — Ambulatory Visit (INDEPENDENT_AMBULATORY_CARE_PROVIDER_SITE_OTHER): Payer: BC Managed Care – PPO | Admitting: Family Medicine

## 2021-09-21 VITALS — BP 112/74 | HR 84 | Temp 98.3°F | Ht 70.5 in | Wt 228.5 lb

## 2021-09-21 DIAGNOSIS — E782 Mixed hyperlipidemia: Secondary | ICD-10-CM

## 2021-09-21 DIAGNOSIS — G2581 Restless legs syndrome: Secondary | ICD-10-CM

## 2021-09-21 DIAGNOSIS — K219 Gastro-esophageal reflux disease without esophagitis: Secondary | ICD-10-CM | POA: Diagnosis not present

## 2021-09-21 NOTE — Progress Notes (Signed)
Chief Complaint  Patient presents with   Follow-up    6 month     Subjective: Hyperlipidemia Patient presents for Hyperlipidemia follow up. Currently taking Crestor 20 mg/d and compliance with treatment thus far has been good. He denies myalgias. He is adhering to a healthy diet. Exercise: some disc golf The patient is not known to have coexisting coronary artery disease. No CP or SOB.  GERD Taking Protonix 40 mg/d. No dysphagia, bleeding, unintentional weight loss, reflux s/s's.   RLS Taking Mirapex 1 mg/d. Compliant, no AE's. Controlling s/s's well.   Past Medical History:  Diagnosis Date   Chest pain in adult 05/07/2018   Lateral epicondylitis of left elbow 07/25/2017   RLS (restless legs syndrome) 07/25/2017    Objective: BP 112/74   Pulse 84   Temp 98.3 F (36.8 C) (Oral)   Ht 5' 10.5" (1.791 m)   Wt 228 lb 8 oz (103.6 kg)   SpO2 95%   BMI 32.32 kg/m  General: Awake, appears stated age HEENT: MMM Heart: RRR, no LE edema, no bruits Lungs: CTAB, no rales, wheezes or rhonchi. No accessory muscle use Abd: BS+, S, NT, ND Psych: Age appropriate judgment and insight, normal affect and mood  Assessment and Plan: Mixed hyperlipidemia  Gastroesophageal reflux disease, unspecified whether esophagitis present  RLS (restless legs syndrome)  Chronic, stable. Cont Crestor 20 mg/d. Counseled on diet/exercise.  Chronic, stable. Cont Protonix 40 mg/d. Reflux precautions discussed.  Chronic, stable. Cont Mirapex 1 mg/d.  F/u in 6 mo or prn. The patient voiced understanding and agreement to the plan.  Jilda Roche [place], DO 09/21/21  3:40 PM

## 2021-09-21 NOTE — Patient Instructions (Signed)
Give us 2-3 business days to get the results of your labs back.   Keep the diet clean and stay active.  The only lifestyle changes that have data behind them are weight loss for the overweight/obese and elevating the head of the bed. Finding out which foods/positions are triggers is important.  Let us know if you need anything.  

## 2021-10-19 ENCOUNTER — Encounter: Payer: Self-pay | Admitting: Family Medicine

## 2021-10-19 ENCOUNTER — Ambulatory Visit (INDEPENDENT_AMBULATORY_CARE_PROVIDER_SITE_OTHER): Payer: BC Managed Care – PPO | Admitting: Family Medicine

## 2021-10-19 VITALS — BP 110/68 | HR 84 | Temp 98.4°F | Ht 70.5 in | Wt 232.1 lb

## 2021-10-19 DIAGNOSIS — M25512 Pain in left shoulder: Secondary | ICD-10-CM

## 2021-10-19 MED ORDER — MELOXICAM 15 MG PO TABS: 15.0000 mg | ORAL_TABLET | Freq: Every day | ORAL | 0 refills | Status: DC

## 2021-10-19 NOTE — Progress Notes (Signed)
Musculoskeletal Exam  Patient: Tyler Stevens DOB: 1975-05-01  DOS: 10/19/2021  SUBJECTIVE:  Chief Complaint:   Chief Complaint  Patient presents with   Shoulder Pain    Tyler Stevens is a 46 y.o.  male for evaluation and treatment of L shoulder pain.   Onset:  4 weeks ago. No inj or change in activity.  Location: top of shoulder Character:  burning  Progression of issue:  is unchanged Associated symptoms: neck pain, slight decreased ROM Denies redness, bruising, swelling Treatment: to date has been rest and wore a sling, some stretches.   Neurovascular symptoms: no  Past Medical History:  Diagnosis Date   Chest pain in adult 05/07/2018   Lateral epicondylitis of left elbow 07/25/2017   RLS (restless legs syndrome) 07/25/2017    Objective: VITAL SIGNS: BP 110/68   Pulse 84   Temp 98.4 F (36.9 C) (Oral)   Ht 5' 10.5" (1.791 m)   Wt 232 lb 2 oz (105.3 kg)   SpO2 95%   BMI 32.84 kg/m  Constitutional: Well formed, well developed. No acute distress. Thorax & Lungs: No accessory muscle use Musculoskeletal: L shoulder.   Normal active range of motion: no.   Normal passive range of motion: no Tenderness to palpation: no Deformity: no Ecchymosis: no Tests positive: Neer's, Hawkins, empty can Tests negative: Speeds, liftoff, crossover, O'Briens Neurologic: Normal sensory function. No focal deficits noted. DTR's equal and symmetric in UE's. No clonus. Psychiatric: Normal mood. Age appropriate judgment and insight. Alert & oriented x 3.    Assessment:  Acute pain of left shoulder - Plan: meloxicam (MOBIC) 15 MG tablet  Plan: Suspect supraspinatus tendinopathy.  Offered injection and physical therapy if no improvement.  Meloxicam as needed.  He has stretches and exercises for her shoulder at home. Heat, ice, Tylenol.  F/u  is originally scheduled . The patient voiced understanding and agreement to the plan.   Tyler Roche [place], DO 10/19/21  4:22 PM

## 2021-10-19 NOTE — Patient Instructions (Addendum)
Heat (pad or rice pillow in microwave) over affected area, 10-15 minutes twice daily.   Ice/cold pack over area for 10-15 min twice daily.  OK to take Tylenol 1000 mg (2 extra strength tabs) or 975 mg (3 regular strength tabs) every 6 hours as needed.  Let me know if we aren't turning the corner.   Let us know if you need anything.

## 2021-10-29 ENCOUNTER — Encounter: Payer: Self-pay | Admitting: Family Medicine

## 2021-10-29 ENCOUNTER — Other Ambulatory Visit: Payer: Self-pay | Admitting: Family Medicine

## 2021-10-29 DIAGNOSIS — M25512 Pain in left shoulder: Secondary | ICD-10-CM

## 2021-11-02 ENCOUNTER — Ambulatory Visit (INDEPENDENT_AMBULATORY_CARE_PROVIDER_SITE_OTHER): Payer: BC Managed Care – PPO | Admitting: Family Medicine

## 2021-11-02 ENCOUNTER — Encounter: Payer: Self-pay | Admitting: Family Medicine

## 2021-11-02 VITALS — BP 118/78 | HR 94 | Temp 98.3°F | Ht 70.5 in | Wt 235.5 lb

## 2021-11-02 DIAGNOSIS — M25512 Pain in left shoulder: Secondary | ICD-10-CM | POA: Diagnosis not present

## 2021-11-02 MED ORDER — METHYLPREDNISOLONE ACETATE 40 MG/ML IJ SUSP
40.0000 mg | Freq: Once | INTRAMUSCULAR | Status: AC
  Administered 2021-11-02: 40 mg via INTRA_ARTICULAR

## 2021-11-02 NOTE — Addendum Note (Signed)
Addended by: Scharlene Gloss B on: 11/02/2021 01:24 PM   Modules accepted: Orders

## 2021-11-02 NOTE — Progress Notes (Signed)
Chief Complaint  Patient presents with   Shoulder Pain    Subjective: Patient is a 46 y.o. male here for L shoulder pain.  Patient was seen 2 weeks ago for left shoulder pain.  It is currently 6 weeks out from initial pain.  He was given stretches and exercises which are only mildly helpful.  Worse when he moves his arm.  He was sent to physical therapy 4 days ago, has not started.  He is interested in an injection today.  No neurologic signs or symptoms, bruising, swelling, or redness.  Past Medical History:  Diagnosis Date   Chest pain in adult 05/07/2018   Lateral epicondylitis of left elbow 07/25/2017   RLS (restless legs syndrome) 07/25/2017    Objective: BP 118/78   Pulse 94   Temp 98.3 F (36.8 C) (Oral)   Ht 5' 10.5" (1.791 m)   Wt 235 lb 8 oz (106.8 kg)   SpO2 95%   BMI 33.31 kg/m  General: Awake, appears stated age MSK: Decreased active and passive range of motion of the left shoulder, no tenderness to palpation, no deformity, no soft tissue swelling, positive Neer's, Hawkins, empty can; negative speeds, crossover, liftoff, O'Briens Lungs: No accessory muscle use Psych: Age appropriate judgment and insight, normal affect and mood  Assessment and Plan: Left shoulder pain, unspecified chronicity - Plan: PR DRAIN/INJECT LARGE JOINT/BURSA  Continue moving forward with physical therapy.  Continue stretches and exercises at home as tolerated.  Bursa injection today.  Ice, heat, Tylenol.  Follow-up as originally scheduled or as needed. The patient voiced understanding and agreement to the plan.  Jilda Roche [place], DO 11/02/21  1:11 PM

## 2021-11-02 NOTE — Patient Instructions (Signed)
Ice/cold pack over area for 10-15 min twice daily.  OK to take Tylenol 1000 mg (2 extra strength tabs) or 975 mg (3 regular strength tabs) every 6 hours as needed.  Continue the stretches/exercises as able.  Let us know if you need anything.

## 2021-11-09 ENCOUNTER — Encounter: Payer: Self-pay | Admitting: Physical Therapy

## 2021-11-09 ENCOUNTER — Ambulatory Visit: Payer: BC Managed Care – PPO | Attending: Family Medicine | Admitting: Physical Therapy

## 2021-11-09 DIAGNOSIS — M25612 Stiffness of left shoulder, not elsewhere classified: Secondary | ICD-10-CM | POA: Diagnosis not present

## 2021-11-09 DIAGNOSIS — R252 Cramp and spasm: Secondary | ICD-10-CM | POA: Diagnosis not present

## 2021-11-09 DIAGNOSIS — M25512 Pain in left shoulder: Secondary | ICD-10-CM | POA: Insufficient documentation

## 2021-11-09 NOTE — Therapy (Unsigned)
OUTPATIENT PHYSICAL THERAPY SHOULDER EVALUATION   Patient Name: Tyler Stevens MRN: [id number] DOB:[birth year], 46 y.o., male Today's Date: 11/09/2021   PT End of Session - 11/09/21 1813     Visit Number 1    Number of Visits 10    Date for PT Re-Evaluation 12/21/21    Authorization Type BCBS    PT Start Time 1620    PT Stop Time 1700    PT Time Calculation (min) 40 min    Activity Tolerance Patient tolerated treatment well    Behavior During Therapy [facility] for tasks assessed/performed             Past Medical History:  Diagnosis Date   Chest pain in adult 05/07/2018   Lateral epicondylitis of left elbow 07/25/2017   RLS (restless legs syndrome) 07/25/2017   Past Surgical History:  Procedure Laterality Date   WISDOM TOOTH EXTRACTION     Patient Active Problem List   Diagnosis Date Noted   Gastroesophageal reflux disease 03/24/2021   Mixed hyperlipidemia 09/09/2020   Chronic heartburn 05/08/2018   Chest pain in adult 05/07/2018   RLS (restless legs syndrome) 07/25/2017   Lateral epicondylitis of left elbow 07/25/2017    PCP: Sharlene Dory, DO   REFERRING PROVIDER: Sharlene Dory, DO  REFERRING DIAG: [phone] (ICD-10-CM) - Acute pain of left shoulder  THERAPY DIAG:  Acute pain of left shoulder  Stiffness of left shoulder, not elsewhere classified  Cramp and spasm  Rationale for Evaluation and Treatment Rehabilitation  ONSET DATE: started 7-8 weeks ago  SUBJECTIVE:                                                                                                                                                                                      SUBJECTIVE STATEMENT: Pt. Reported pain just started 7-8 weeks ago, just woke up that way, didn't do anything to it.  Given injection in L shoulder on 11/02/2021.  That helped the posterior shoulder pain but still having pain down the front of shoulder down into bicep.  Can't sleep on L side, aches if lay  on back too long.  Wakes up in middle of night due to pain.    PERTINENT HISTORY: GERD, RLS  PAIN:  Are you having pain? Yes: NPRS scale: 0-6/10 Pain location: L anterior shoulder  Pain description: burning, occasional tingling down into finger tips Aggravating factors: raising overhead, turning steering wheel, reaching for wallet Relieving factors: injection  PRECAUTIONS: None  WEIGHT BEARING RESTRICTIONS No  FALLS:  Has patient fallen in last 6 months? No  LIVING ENVIRONMENT: Lives with: lives with their family Lives in: House/apartment Stairs: No Has  following equipment at home: None  OCCUPATION: Programmer, multimedia - involves lifting and pulling   PLOF: Independent no regular exercise program  PATIENT GOALS: evaluate and see what I can do at home, not sure if insurance will cover.   OBJECTIVE:   DIAGNOSTIC FINDINGS:  None taken  PATIENT SURVEYS:  Quick Dash 38.6% disability   COGNITION:  Overall cognitive status: Within functional limits for tasks assessed     SENSATION: WFL  POSTURE: Slightly rounded shoulders.    UPPER EXTREMITY ROM:   Active ROM Right eval Left eval  Shoulder flexion 150 120*  Shoulder extension 75 50*  Shoulder abduction 155 140*  Shoulder adduction    Shoulder internal rotation To low back To L hip* (passive 55)  Shoulder external rotation To neck To neck* (passive 85 without pain)  (Blank rows = not tested)  UPPER EXTREMITY MMT:  MMT Right eval Left* eval  Shoulder flexion 5 5  Shoulder extension    Shoulder abduction 5 5  Shoulder adduction    Shoulder internal rotation 5 5  Shoulder external rotation 5 4+*  Elbow flexion 5 5  Elbow extension 5 5  Wrist flexion 5 5  Wrist extension 5 5  Grip strength (lbs) good good  (Blank rows = not tested)  SHOULDER SPECIAL TESTS:  Impingement tests: Painful arc test: positive    Rotator cuff assessment: Full can test: positive    JOINT MOBILITY TESTING:  No  capsular tightness  PALPATION:  Tenderness anterior shoulder over biceps tendon, bursa, subscapularis attachment.    TODAY'S TREATMENT:  11/09/2021 Therapeutic Exercise: to improve strength and mobility.  Demo, verbal and tactile cues throughout for technique. Exercises - Seated Cervical Retraction - 10 reps - Seated Scapular Retraction  - 10 reps - 5 sec  hold - Seated Shoulder Rolls   - 10 reps - Standing Shoulder Row with Anchored Resistance  - 10 reps RTB - Shoulder extension with resistance - Neutral  - 10 reps RTB   PATIENT EDUCATION: Education details: shoulder anatomy, findings, POC and initial HEP.  Issued RTB Person educated: Patient Education method: Explanation, Demonstration, Verbal cues, and Handouts Education comprehension: verbalized understanding and returned demonstration   HOME EXERCISE PROGRAM: Access Code: [email or web address] URL: [email or web address] Date: 11/09/2021 Prepared by: Harrie Foreman   ASSESSMENT:  CLINICAL IMPRESSION: Patient is a 46 y.o. right hand dominant male who was seen today for physical therapy evaluation and treatment for left shoulder pain.  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  Noted weakness due to pain with external rotation, and limited ROM in L shoulder due to pain. Today he was given postural exercises to strengthen posterior shoulder girdle which he was able to do without pain.  He would benefit from skilled physical therapy to decrease L shoulder pain, restore full ROM and allow him to perform work activities without limitation.    OBJECTIVE IMPAIRMENTS decreased mobility, decreased ROM, decreased strength, increased muscle spasms, impaired UE functional use, postural dysfunction, and pain.   ACTIVITY LIMITATIONS carrying, lifting, bending, sleeping, reach over head, and hygiene/grooming  PARTICIPATION LIMITATIONS: cleaning, driving, and  occupation  PERSONAL FACTORS 1-2 comorbidities: GERD, RLS  are also affecting patient's functional outcome.   REHAB POTENTIAL: Excellent  CLINICAL DECISION MAKING: Stable/uncomplicated  EVALUATION COMPLEXITY: Low   GOALS: Goals reviewed with patient? Yes  SHORT TERM GOALS: Target date: 11/23/2021   Patient will be independent with initial HEP.  Baseline: given Goal status: INITIAL  LONG TERM GOALS: Target date: 12/21/2021   Patient will be independent with advanced/ongoing HEP to improve outcomes and carryover.  Baseline: needs progression Goal status: INITIAL  2.  Patient will report 75% improvement in Left shoulder pain to improve QOL.  Baseline: 6/10 overhead movements, at night; interrupting sleep Goal status: INITIAL  3.  Patient to improve Left shoulder AROM to WNL without pain provocation to allow for increased ease of ADLs.  Baseline: see objective, painful arc 120 Goal status: INITIAL  4.  Patient will demonstrate improved functional UE strength as demonstrated by 5/5 L shoulder strength without pain. Baseline: see objective Goal status: INITIAL  5.  Patient will report <20% impairment on QuickDash  to demonstrate improved functional ability.  Baseline: 38.6% Goal status: INITIAL  6.  Patient will be able to sleep without interruption from L shoulder pain.    Baseline: frequently wakes due to pain Goal status: INITIAL   7. Patient will be able to lift 40lbs without increased pain for work activities.  Baseline:  Goal status: INITIAL    PLAN: PT FREQUENCY: 1-2x/week  PT DURATION: 6 weeks  PLANNED INTERVENTIONS: Therapeutic exercises, Therapeutic activity, Neuromuscular re-education, Balance training, Gait training, Patient/Family education, Self Care, Joint mobilization, Dry Needling, Electrical stimulation, Spinal mobilization, Cryotherapy, Moist heat, Taping, Vasopneumatic device, Ultrasound, Ionotophoresis 4mg /ml Dexamethasone, Manual therapy, and  Re-evaluation  PLAN FOR NEXT SESSION: progress exercises for strengthening, manual therapy, trial DN to pec, subscap, biceps, can also trial iontophoresis anterior shoulder.     , PT, DPT  11/09/2021, 6:15 PM

## 2021-11-11 ENCOUNTER — Ambulatory Visit: Payer: BC Managed Care – PPO

## 2021-11-11 DIAGNOSIS — M25512 Pain in left shoulder: Secondary | ICD-10-CM | POA: Diagnosis not present

## 2021-11-11 DIAGNOSIS — M25612 Stiffness of left shoulder, not elsewhere classified: Secondary | ICD-10-CM

## 2021-11-11 DIAGNOSIS — R252 Cramp and spasm: Secondary | ICD-10-CM | POA: Diagnosis not present

## 2021-11-11 NOTE — Therapy (Signed)
OUTPATIENT PHYSICAL THERAPY TREATMENT   Patient Name: Tyler Stevens MRN: [id number] DOB:10-02-1975, 46 y.o., male Today's Date: 11/11/2021   PT End of Session - 11/11/21 1702     Visit Number 2    Number of Visits 10    Date for PT Re-Evaluation 12/21/21    Authorization Type BCBS    PT Start Time 1618    PT Stop Time 1700    PT Time Calculation (min) 42 min    Activity Tolerance Patient tolerated treatment well    Behavior During Therapy [facility] for tasks assessed/performed              Past Medical History:  Diagnosis Date   Chest pain in adult 05/07/2018   Lateral epicondylitis of left elbow 07/25/2017   RLS (restless legs syndrome) 07/25/2017   Past Surgical History:  Procedure Laterality Date   WISDOM TOOTH EXTRACTION     Patient Active Problem List   Diagnosis Date Noted   Gastroesophageal reflux disease 03/24/2021   Mixed hyperlipidemia 09/09/2020   Chronic heartburn 05/08/2018   Chest pain in adult 05/07/2018   RLS (restless legs syndrome) 07/25/2017   Lateral epicondylitis of left elbow 07/25/2017    PCP: Sharlene Dory, DO   REFERRING PROVIDER: Sharlene Dory, DO  REFERRING DIAG: [phone] (ICD-10-CM) - Acute pain of left shoulder  THERAPY DIAG:  Acute pain of left shoulder  Stiffness of left shoulder, not elsewhere classified  Cramp and spasm  Rationale for Evaluation and Treatment Rehabilitation  ONSET DATE: started 7-8 weeks ago  SUBJECTIVE:                                                                                                                                                                                      SUBJECTIVE STATEMENT: Pt reports no pain at the moment.   PERTINENT HISTORY: GERD, RLS  PAIN:  Are you having pain? Yes: NPRS scale: 0-6/10 Pain location: L anterior shoulder  Pain description: burning, occasional tingling down into finger tips Aggravating factors: raising overhead, turning steering  wheel, reaching for wallet Relieving factors: injection  PRECAUTIONS: None  WEIGHT BEARING RESTRICTIONS No  FALLS:  Has patient fallen in last 6 months? No  LIVING ENVIRONMENT: Lives with: lives with their family Lives in: House/apartment Stairs: No Has following equipment at home: None  OCCUPATION: Programmer, multimedia - involves lifting and pulling   PLOF: Independent no regular exercise program  PATIENT GOALS: evaluate and see what I can do at home, not sure if insurance will cover.   OBJECTIVE:   DIAGNOSTIC FINDINGS:  None taken  PATIENT SURVEYS:  Quick Dash 38.6% disability   COGNITION:  Overall cognitive status: Within functional limits for tasks assessed     SENSATION: WFL  POSTURE: Slightly rounded shoulders.    UPPER EXTREMITY ROM:   Active ROM Right eval Left eval  Shoulder flexion 150 120*  Shoulder extension 75 50*  Shoulder abduction 155 140*  Shoulder adduction    Shoulder internal rotation To low back To L hip* (passive 55)  Shoulder external rotation To neck To neck* (passive 85 without pain)  (Blank rows = not tested)  UPPER EXTREMITY MMT:  MMT Right eval Left* eval  Shoulder flexion 5 5  Shoulder extension    Shoulder abduction 5 5  Shoulder adduction    Shoulder internal rotation 5 5  Shoulder external rotation 5 4+*  Elbow flexion 5 5  Elbow extension 5 5  Wrist flexion 5 5  Wrist extension 5 5  Grip strength (lbs) good good  (Blank rows = not tested)  SHOULDER SPECIAL TESTS:  Impingement tests: Painful arc test: positive    Rotator cuff assessment: Full can test: positive    JOINT MOBILITY TESTING:  No capsular tightness  PALPATION:  Tenderness anterior shoulder over biceps tendon, bursa, subscapularis attachment.    TODAY'S TREATMENT:  11/11/21 Therapeutic Exercise: UBE L1.0 3 min fwd/57min back Standing ER red TB isometric 10x 3 sec hold Wall push ups x 10 Rows with green TB x 10 Extensions with green  TB x 10 Low pec stretch at doorway 2x30" - pain with higher shoulder positions Seated ER AROM with arm rested on mat table 10x   11/09/2021 Therapeutic Exercise: to improve strength and mobility.  Demo, verbal and tactile cues throughout for technique. Exercises - Seated Cervical Retraction - 10 reps - Seated Scapular Retraction  - 10 reps - 5 sec  hold - Seated Shoulder Rolls   - 10 reps - Standing Shoulder Row with Anchored Resistance  - 10 reps RTB - Shoulder extension with resistance - Neutral  - 10 reps RTB   PATIENT EDUCATION: Education details: HEP update Person educated: Patient Education method: Explanation, Demonstration, Verbal cues, and Handouts Education comprehension: verbalized understanding and returned demonstration   HOME EXERCISE PROGRAM: Access Code: [email or web address] URL: [email or web address] Date: 11/09/2021 Prepared by: Harrie Foreman   ASSESSMENT:  CLINICAL IMPRESSION: Good response to treatment. Progressed postural strengthening and initiated RTC strengthening. Provided cues and education on ideal posture and importance of RTC strengthening to keep humeral head depressed. Pt had pain with trying mid or high pec stretches, otherwise no issues with other exercises.    OBJECTIVE IMPAIRMENTS decreased mobility, decreased ROM, decreased strength, increased muscle spasms, impaired UE functional use, postural dysfunction, and pain.   ACTIVITY LIMITATIONS carrying, lifting, bending, sleeping, reach over head, and hygiene/grooming  PARTICIPATION LIMITATIONS: cleaning, driving, and occupation  PERSONAL FACTORS 1-2 comorbidities: GERD, RLS  are also affecting patient's functional outcome.   REHAB POTENTIAL: Excellent  CLINICAL DECISION MAKING: Stable/uncomplicated  EVALUATION COMPLEXITY: Low   GOALS: Goals reviewed with patient? Yes  SHORT TERM GOALS: Target date: 11/23/2021   Patient will be independent with initial HEP.  Baseline:  given Goal status: INITIAL   LONG TERM GOALS: Target date: 12/21/2021   Patient will be independent with advanced/ongoing HEP to improve outcomes and carryover.  Baseline: needs progression Goal status: INITIAL  2.  Patient will report 75% improvement in Left shoulder pain to improve QOL.  Baseline: 6/10 overhead movements, at night; interrupting sleep Goal status: INITIAL  3.  Patient to improve Left shoulder AROM to WNL without pain  provocation to allow for increased ease of ADLs.  Baseline: see objective, painful arc 120 Goal status: INITIAL  4.  Patient will demonstrate improved functional UE strength as demonstrated by 5/5 L shoulder strength without pain. Baseline: see objective Goal status: INITIAL  5.  Patient will report <20% impairment on QuickDash  to demonstrate improved functional ability.  Baseline: 38.6% Goal status: INITIAL  6.  Patient will be able to sleep without interruption from L shoulder pain.    Baseline: frequently wakes due to pain Goal status: INITIAL   7. Patient will be able to lift 40lbs without increased pain for work activities.  Baseline:  Goal status: INITIAL    PLAN: PT FREQUENCY: 1-2x/week  PT DURATION: 6 weeks  PLANNED INTERVENTIONS: Therapeutic exercises, Therapeutic activity, Neuromuscular re-education, Balance training, Gait training, Patient/Family education, Self Care, Joint mobilization, Dry Needling, Electrical stimulation, Spinal mobilization, Cryotherapy, Moist heat, Taping, Vasopneumatic device, Ultrasound, Ionotophoresis 4mg /ml Dexamethasone, Manual therapy, and Re-evaluation  PLAN FOR NEXT SESSION: progress exercises for strengthening, manual therapy, trial DN to pec, subscap, biceps, can also trial iontophoresis anterior shoulder.     , PTA 11/11/2021, 5:02 PM

## 2021-11-17 ENCOUNTER — Ambulatory Visit: Payer: BC Managed Care – PPO | Attending: Family Medicine

## 2021-11-17 DIAGNOSIS — M25612 Stiffness of left shoulder, not elsewhere classified: Secondary | ICD-10-CM | POA: Diagnosis present

## 2021-11-17 DIAGNOSIS — R252 Cramp and spasm: Secondary | ICD-10-CM | POA: Diagnosis present

## 2021-11-17 DIAGNOSIS — M25512 Pain in left shoulder: Secondary | ICD-10-CM | POA: Diagnosis present

## 2021-11-17 NOTE — Therapy (Signed)
OUTPATIENT PHYSICAL THERAPY TREATMENT   Patient Name: Tyler Stevens MRN: [id number] DOB:30-Nov-1975, 46 y.o., male Today's Date: 11/17/2021   PT End of Session - 11/17/21 1754     Visit Number 3    Number of Visits 10    Date for PT Re-Evaluation 12/21/21    Authorization Type [employer]    PT Start Time 1702    PT Stop Time 4268    PT Time Calculation (min) 33 min    Activity Tolerance Patient tolerated treatment well    Behavior During Therapy [facility] for tasks assessed/performed               Past Medical History:  Diagnosis Date   Chest pain in adult 05/07/2018   Lateral epicondylitis of left elbow 07/25/2017   RLS (restless legs syndrome) 07/25/2017   Past Surgical History:  Procedure Laterality Date   WISDOM TOOTH EXTRACTION     Patient Active Problem List   Diagnosis Date Noted   Gastroesophageal reflux disease 03/24/2021   Mixed hyperlipidemia 09/09/2020   Chronic heartburn 05/08/2018   Chest pain in adult 05/07/2018   RLS (restless legs syndrome) 07/25/2017   Lateral epicondylitis of left elbow 07/25/2017    PCP: Shelda Pal, DO   REFERRING PROVIDER: Shelda Pal, DO  REFERRING DIAG: [phone] (ICD-10-CM) - Acute pain of left shoulder  THERAPY DIAG:  Acute pain of left shoulder  Stiffness of left shoulder, not elsewhere classified  Cramp and spasm  Rationale for Evaluation and Treatment Rehabilitation  ONSET DATE: started 7-8 weeks ago  SUBJECTIVE:                                                                                                                                                                                      SUBJECTIVE STATEMENT: Doing good, did set up a lot of chairs at work so am a little sore.  PERTINENT HISTORY: GERD, RLS  PAIN:  Are you having pain? Yes: NPRS scale: 0-6/10 Pain location: L anterior shoulder  Pain description: burning, occasional tingling down into finger tips Aggravating factors:  raising overhead, turning steering wheel, reaching for wallet Relieving factors: injection  PRECAUTIONS: None  WEIGHT BEARING RESTRICTIONS No  FALLS:  Has patient fallen in last 6 months? 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   OBJECTIVE:   DIAGNOSTIC FINDINGS:  None taken  PATIENT SURVEYS:  Quick Dash 38.6% disability   COGNITION:  Overall cognitive status: Within functional limits for tasks assessed     SENSATION: WFL  POSTURE: Slightly rounded shoulders.    UPPER EXTREMITY ROM:   Active ROM Right eval Left eval  Shoulder flexion 150 120*  Shoulder extension 75 50*  Shoulder abduction 155 140*  Shoulder adduction    Shoulder internal rotation To low back To L hip* (passive 55)  Shoulder external rotation To neck To neck* (passive 85 without pain)  (Blank rows = not tested)  UPPER EXTREMITY MMT:  MMT Right eval Left* eval  Shoulder flexion 5 5  Shoulder extension    Shoulder abduction 5 5  Shoulder adduction    Shoulder internal rotation 5 5  Shoulder external rotation 5 4+*  Elbow flexion 5 5  Elbow extension 5 5  Wrist flexion 5 5  Wrist extension 5 5  Grip strength (lbs) good good  (Blank rows = not tested)  SHOULDER SPECIAL TESTS:  Impingement tests: Painful arc test: positive    Rotator cuff assessment: Full can test: positive    JOINT MOBILITY TESTING:  No capsular tightness  PALPATION:  Tenderness anterior shoulder over biceps tendon, bursa, subscapularis attachment.    TODAY'S TREATMENT:  11/17/21 TE: UBE L2 (14f3b) Doorway pec stretch low 2x30 sec Standing ER with green TB x 10 bil Standing IR green TB x 10 bil Standing  serratus slide with foam roll x 10 Standing wall clocks with red TB x 5 Ball on wall CCW x 20 L UE Standing D1 flexion L UE x 10  11/11/21 Therapeutic Exercise: UBE L1.0 3 min fwd/335m back Standing ER red TB isometric 10x 3 sec hold Wall push ups x 10 Rows with green TB x 10 Extensions with green TB x 10 Low pec stretch at doorway 2x30" - pain with higher shoulder positions Seated ER AROM with arm rested on mat table 10x   11/09/2021 Therapeutic Exercise: to improve strength and mobility.  Demo, verbal and tactile cues throughout for technique. Exercises - Seated Cervical Retraction - 10 reps - Seated Scapular Retraction  - 10 reps - 5 sec  hold - Seated Shoulder Rolls   - 10 reps - Standing Shoulder Row with Anchored Resistance  - 10 reps RTB - Shoulder extension with resistance - Neutral  - 10 reps RTB   PATIENT EDUCATION: Education details: HEP update Person educated: Patient Education method: Explanation, Demonstration, Verbal cues, and Handouts Education comprehension: verbalized understanding and returned demonstration   HOME EXERCISE PROGRAM: Access Code: [email or web address]   ASSESSMENT:  CLINICAL IMPRESSION: Pt responded well to treatment. Continued to progress RTC strengthening to tolerance. Provided cuing as needed with exercises. Session ended early d/t an appointment he had to go to at 6 pm   OBJECTIVE IMPAIRMENTS decreased mobility, decreased ROM, decreased strength, increased muscle spasms, impaired UE functional use, postural dysfunction, and pain.   ACTIVITY LIMITATIONS carrying, lifting, bending, sleeping, reach over head, and hygiene/grooming  PARTICIPATION LIMITATIONS: cleaning, driving, and occupation  PERSONAL FACTORS 1-2 comorbidities: GERD, RLS  are also affecting patient's functional outcome.   REHAB POTENTIAL: Excellent  CLINICAL DECISION MAKING: Stable/uncomplicated  EVALUATION COMPLEXITY: Low   GOALS: Goals reviewed with patient? Yes  SHORT  TERM GOALS: Target date: 11/23/2021   Patient will be independent with initial HEP.  Baseline: given Goal status: MET - 11/17/21   LONG TERM GOALS: Target date: 12/21/2021   Patient will be independent with advanced/ongoing HEP to improve outcomes and carryover.  Baseline: needs progression Goal status: INITIAL  2.  Patient will report 75% improvement in Left shoulder pain to improve QOL.  Baseline: 6/10 overhead movements, at night; interrupting sleep Goal status: INITIAL  3.  Patient to improve Left shoulder AROM to WNL without pain provocation to allow for increased ease of ADLs.  Baseline: see objective, painful arc 120 Goal status: INITIAL  4.  Patient will demonstrate improved functional UE strength as demonstrated by 5/5 L shoulder strength without pain. Baseline: see objective Goal status: INITIAL  5.  Patient will report <20% impairment on QuickDash  to demonstrate improved functional ability.  Baseline: 38.6% Goal status: INITIAL  6.  Patient will be able to sleep without interruption from L shoulder pain.    Baseline: frequently wakes due to pain Goal status: INITIAL   7. Patient will be able to lift 40lbs without increased pain for work activities.  Baseline:  Goal status: INITIAL    PLAN: PT FREQUENCY: 1-2x/week  PT DURATION: 6 weeks  PLANNED INTERVENTIONS: Therapeutic exercises, Therapeutic activity, Neuromuscular re-education, Balance training, Gait training, Patient/Family education, Self Care, Joint mobilization, Dry Needling, Electrical stimulation, Spinal mobilization, Cryotherapy, Moist heat, Taping, Vasopneumatic device, Ultrasound, Ionotophoresis 90m/ml Dexamethasone, Manual therapy, and Re-evaluation  PLAN FOR NEXT SESSION: progress exercises for strengthening, manual therapy, trial DN to pec, subscap, biceps, can also trial iontophoresis anterior shoulder.     BArtist Pais PTA 11/17/2021, 5:54 PM

## 2021-11-22 ENCOUNTER — Ambulatory Visit: Payer: BC Managed Care – PPO

## 2021-11-22 DIAGNOSIS — R252 Cramp and spasm: Secondary | ICD-10-CM

## 2021-11-22 DIAGNOSIS — M25512 Pain in left shoulder: Secondary | ICD-10-CM | POA: Diagnosis not present

## 2021-11-22 DIAGNOSIS — M25612 Stiffness of left shoulder, not elsewhere classified: Secondary | ICD-10-CM

## 2021-11-22 NOTE — Therapy (Signed)
OUTPATIENT PHYSICAL THERAPY TREATMENT   Patient Name: Tyler Stevens MRN: [id number] DOB:1976/03/07, 46 y.o., male Today's Date: 11/22/2021   PT End of Session - 11/22/21 1701     Visit Number 4    Number of Visits 10    Date for PT Re-Evaluation 12/21/21    Authorization Type [employer]    PT Start Time 8413    PT Stop Time 1700    PT Time Calculation (min) 47 min    Activity Tolerance Patient tolerated treatment well    Behavior During Therapy [facility] for tasks assessed/performed                Past Medical History:  Diagnosis Date   Chest pain in adult 05/07/2018   Lateral epicondylitis of left elbow 07/25/2017   RLS (restless legs syndrome) 07/25/2017   Past Surgical History:  Procedure Laterality Date   WISDOM TOOTH EXTRACTION     Patient Active Problem List   Diagnosis Date Noted   Gastroesophageal reflux disease 03/24/2021   Mixed hyperlipidemia 09/09/2020   Chronic heartburn 05/08/2018   Chest pain in adult 05/07/2018   RLS (restless legs syndrome) 07/25/2017   Lateral epicondylitis of left elbow 07/25/2017    PCP: Shelda Pal, DO   REFERRING PROVIDER: Shelda Pal, DO  REFERRING DIAG: [phone] (ICD-10-CM) - Acute pain of left shoulder  THERAPY DIAG:  Acute pain of left shoulder  Stiffness of left shoulder, not elsewhere classified  Cramp and spasm  Rationale for Evaluation and Treatment Rehabilitation  ONSET DATE: started 7-8 weeks ago  SUBJECTIVE:                                                                                                                                                                                      SUBJECTIVE STATEMENT: Pt reports resting pain is good but pain working overhead is bad.  PERTINENT HISTORY: GERD, RLS  PAIN:  Are you having pain? Yes: NPRS scale: 6-7/10 Pain location: L anterior shoulder  Pain description: burning, occasional tingling down into finger tips Aggravating factors:  raising overhead, turning steering wheel, reaching for wallet Relieving factors: injection  PRECAUTIONS: None  WEIGHT BEARING RESTRICTIONS No  FALLS:  Has patient fallen in last 6 months? 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   OBJECTIVE:   DIAGNOSTIC FINDINGS:  None taken  PATIENT SURVEYS:  Katina Dung  38.6% disability   COGNITION:  Overall cognitive status: Within functional limits for tasks assessed     SENSATION: WFL  POSTURE: Slightly rounded shoulders.    UPPER EXTREMITY ROM:   Active ROM Right eval Left eval  Shoulder flexion 150 120*  Shoulder extension 75 50*  Shoulder abduction 155 140*  Shoulder adduction    Shoulder internal rotation To low back To L hip* (passive 55)  Shoulder external rotation To neck To neck* (passive 85 without pain)  (Blank rows = not tested)  UPPER EXTREMITY MMT:  MMT Right eval Left* eval  Shoulder flexion 5 5  Shoulder extension    Shoulder abduction 5 5  Shoulder adduction    Shoulder internal rotation 5 5  Shoulder external rotation 5 4+*  Elbow flexion 5 5  Elbow extension 5 5  Wrist flexion 5 5  Wrist extension 5 5  Grip strength (lbs) good good  (Blank rows = not tested)  SHOULDER SPECIAL TESTS:  Impingement tests: Painful arc test: positive    Rotator cuff assessment: Full can test: positive    JOINT MOBILITY TESTING:  No capsular tightness  PALPATION:  Tenderness anterior shoulder over biceps tendon, bursa, subscapularis attachment.    TODAY'S TREATMENT:  11/22/21 UBE L2 3 min fwd/ 3 min back Standing ER green TB 2x10 Standing L shld ext green TB 2x10 BATCA lat pull 20# 2x10 Standing L UE  high horiz ABD x 10 red TB - difficulty kicking in persicap muscles TC for contraction  Manual Therapy: STM to L infraspinatus, LS, ant deltoid, proximal bicep attachment  11/17/21 TE: UBE L2 (40f3b) Doorway pec stretch low 2x30 sec Standing ER with green TB x 10 bil Standing IR green TB x 10 bil Standing serratus slide with foam roll x 10 Standing wall clocks with red TB x 5 Ball on wall CCW x 20 L UE Standing D1 flexion L UE x 10  11/11/21 Therapeutic Exercise: UBE L1.0 3 min fwd/339m back Standing ER red TB isometric 10x 3 sec hold Wall push ups x 10 Rows with green TB x 10 Extensions with green TB x 10 Low pec stretch at doorway 2x30" - pain with higher shoulder positions Seated ER AROM with arm rested on mat table 10x   11/09/2021 Therapeutic Exercise: to improve strength and mobility.  Demo, verbal and tactile cues throughout for technique. Exercises - Seated Cervical Retraction - 10 reps - Seated Scapular Retraction  - 10 reps - 5 sec  hold - Seated Shoulder Rolls   - 10 reps - Standing Shoulder Row with Anchored Resistance  - 10 reps RTB - Shoulder extension with resistance - Neutral  - 10 reps RTB   PATIENT EDUCATION: Education details: HEP update Person educated: Patient Education method: Explanation, Demonstration, Verbal cues, and Handouts Education comprehension: verbalized understanding and returned demonstration   HOME EXERCISE PROGRAM: Access Code: [email or web address]   ASSESSMENT:  CLINICAL IMPRESSION: Focused session on manual to decrease pain and TTP along L periscap muscles and anterior shoulder. He was very tender along the ant deltoid and biceps. Focused strengthening on progressing posterior shoulder exercises to improve postural alignment and position of scapulohumeral joint. Good response to treatment, although he needs more work on periscap facilitation on L shoulder.   OBJECTIVE IMPAIRMENTS decreased mobility, decreased ROM, decreased strength,  increased muscle spasms, impaired UE functional use, postural dysfunction, and pain.   ACTIVITY LIMITATIONS carrying, lifting, bending, sleeping, reach over head, and hygiene/grooming  PARTICIPATION LIMITATIONS: cleaning, driving, and occupation  PERSONAL FACTORS 1-2 comorbidities: GERD, RLS  are also affecting patient's functional outcome.   REHAB POTENTIAL: Excellent  CLINICAL DECISION MAKING: Stable/uncomplicated  EVALUATION COMPLEXITY: Low   GOALS: Goals reviewed with patient? Yes  SHORT TERM GOALS: Target date: 11/23/2021   Patient will be independent with initial HEP.  Baseline: given Goal status: MET - 11/17/21   LONG TERM GOALS: Target date: 12/21/2021   Patient will be independent with advanced/ongoing HEP to improve outcomes and carryover.  Baseline: needs progression Goal status: INITIAL  2.  Patient will report 75% improvement in Left shoulder pain to improve QOL.  Baseline: 6/10 overhead movements, at night; interrupting sleep Goal status: INITIAL  3.  Patient to improve Left shoulder AROM to WNL without pain provocation to allow for increased ease of ADLs.  Baseline: see objective, painful arc 120 Goal status: INITIAL  4.  Patient will demonstrate improved functional UE strength as demonstrated by 5/5 L shoulder strength without pain. Baseline: see objective Goal status: INITIAL  5.  Patient will report <20% impairment on QuickDash  to demonstrate improved functional ability.  Baseline: 38.6% Goal status: INITIAL  6.  Patient will be able to sleep without interruption from L shoulder pain.    Baseline: frequently wakes due to pain Goal status: INITIAL   7. Patient will be able to lift 40lbs without increased pain for work activities.  Baseline:  Goal status: INITIAL    PLAN: PT FREQUENCY: 1-2x/week  PT DURATION: 6 weeks  PLANNED INTERVENTIONS: Therapeutic exercises, Therapeutic activity, Neuromuscular re-education, Balance training, Gait  training, Patient/Family education, Self Care, Joint mobilization, Dry Needling, Electrical stimulation, Spinal mobilization, Cryotherapy, Moist heat, Taping, Vasopneumatic device, Ultrasound, Ionotophoresis 30m/ml Dexamethasone, Manual therapy, and Re-evaluation  PLAN FOR NEXT SESSION: facilitate posterior shoulder muscles; manual therapy, trial DN to pec, subscap, biceps, can also trial iontophoresis anterior shoulder.     BArtist Pais PTA 11/22/2021, 5:01 PM

## 2021-11-24 ENCOUNTER — Ambulatory Visit: Payer: BC Managed Care – PPO

## 2021-11-24 DIAGNOSIS — R252 Cramp and spasm: Secondary | ICD-10-CM

## 2021-11-24 DIAGNOSIS — M25512 Pain in left shoulder: Secondary | ICD-10-CM

## 2021-11-24 DIAGNOSIS — M25612 Stiffness of left shoulder, not elsewhere classified: Secondary | ICD-10-CM

## 2021-11-24 NOTE — Therapy (Signed)
OUTPATIENT PHYSICAL THERAPY TREATMENT   Patient Name: Tyler Stevens MRN: [id number] DOB:02-Dec-1975, 46 y.o., male Today's Date: 11/24/2021   PT End of Session - 11/24/21 1752     Visit Number 5    Number of Visits 10    Date for PT Re-Evaluation 12/21/21    Authorization Type BCBS    PT Start Time 1703    PT Stop Time 6759    PT Time Calculation (min) 42 min    Activity Tolerance Patient tolerated treatment well    Behavior During Therapy [facility] for tasks assessed/performed                 Past Medical History:  Diagnosis Date   Chest pain in adult 05/07/2018   Lateral epicondylitis of left elbow 07/25/2017   RLS (restless legs syndrome) 07/25/2017   Past Surgical History:  Procedure Laterality Date   WISDOM TOOTH EXTRACTION     Patient Active Problem List   Diagnosis Date Noted   Gastroesophageal reflux disease 03/24/2021   Mixed hyperlipidemia 09/09/2020   Chronic heartburn 05/08/2018   Chest pain in adult 05/07/2018   RLS (restless legs syndrome) 07/25/2017   Lateral epicondylitis of left elbow 07/25/2017    PCP: Shelda Pal, DO   REFERRING PROVIDER: Shelda Pal, DO  REFERRING DIAG: [phone] (ICD-10-CM) - Acute pain of left shoulder  THERAPY DIAG:  Acute pain of left shoulder  Stiffness of left shoulder, not elsewhere classified  Cramp and spasm  Rationale for Evaluation and Treatment Rehabilitation  ONSET DATE: started 7-8 weeks ago  SUBJECTIVE:                                                                                                                                                                                      SUBJECTIVE STATEMENT: Doing good today, no complaints, feels like its getting better.  PERTINENT HISTORY: GERD, RLS  PAIN:  Are you having pain? No  PRECAUTIONS: None  WEIGHT BEARING RESTRICTIONS No  FALLS:  Has patient fallen in last 6 months? 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   OBJECTIVE:   DIAGNOSTIC FINDINGS:  None taken  PATIENT SURVEYS:  Quick Dash 38.6% disability   COGNITION:  Overall cognitive status: Within functional limits for tasks assessed     SENSATION: WFL  POSTURE: Slightly rounded shoulders.    UPPER EXTREMITY ROM:  Active ROM Right eval Left eval  Shoulder flexion 150 120*  Shoulder extension 75 50*  Shoulder abduction 155 140*  Shoulder adduction    Shoulder internal rotation To low back To L hip* (passive 55)  Shoulder external rotation To neck To neck* (passive 85 without pain)  (Blank rows = not tested)  UPPER EXTREMITY MMT:  MMT Right eval Left* eval  Shoulder flexion 5 5  Shoulder extension    Shoulder abduction 5 5  Shoulder adduction    Shoulder internal rotation 5 5  Shoulder external rotation 5 4+*  Elbow flexion 5 5  Elbow extension 5 5  Wrist flexion 5 5  Wrist extension 5 5  Grip strength (lbs) good good  (Blank rows = not tested)  SHOULDER SPECIAL TESTS:  Impingement tests: Painful arc test: positive    Rotator cuff assessment: Full can test: positive    JOINT MOBILITY TESTING:  No capsular tightness  PALPATION:  Tenderness anterior shoulder over biceps tendon, bursa, subscapularis attachment.    TODAY'S TREATMENT:  11/24/21 UBE L3 52f3b Standing high horiz ABD red TB x 20 Standing shld ext green TB x 20 BATCA L UE lat pull 15# x 10 Standing ER green TB 5x 5 sec hold Standing prayer AAROM into flexion x 10 Modified post capsule stretch x 30 sec UT and levator x 30 sec both  Manual Therapy: STM to L infraspinatus, teres group, UT, LS - trigger points in every muscle  11/22/21 UBE L2  3 min fwd/ 3 min back Standing ER green TB 2x10 Standing L shld ext green TB 2x10 BATCA lat pull 20# 2x10 Standing L UE high horiz ABD x 10 red TB - difficulty kicking in persicap muscles TC for contraction  Manual Therapy: STM to L infraspinatus, LS, ant deltoid, proximal bicep attachment  11/17/21 TE: UBE L2 (357fb) Doorway pec stretch low 2x30 sec Standing ER with green TB x 10 bil Standing IR green TB x 10 bil Standing serratus slide with foam roll x 10 Standing wall clocks with red TB x 5 Ball on wall CCW x 20 L UE Standing D1 flexion L UE x 10  11/11/21 Therapeutic Exercise: UBE L1.0 3 min fwd/[id number] Standing ER red TB isometric 10x 3 sec hold Wall push ups x 10 Rows with green TB x 10 Extensions with green TB x 10 Low pec stretch at doorway 2x30" - pain with higher shoulder positions Seated ER AROM with arm rested on mat table 10x   11/09/2021 Therapeutic Exercise: to improve strength and mobility.  Demo, verbal and tactile cues throughout for technique. Exercises - Seated Cervical Retraction - 10 reps - Seated Scapular Retraction  - 10 reps - 5 sec  hold - Seated Shoulder Rolls   - 10 reps - Standing Shoulder Row with Anchored Resistance  - 10 reps RTB - Shoulder extension with resistance - Neutral  - 10 reps RTB   PATIENT EDUCATION: Education details: HEP update Person educated: Patient Education method: Explanation, Demonstration, Verbal cues, and Handouts Education comprehension: verbalized understanding and returned demonstration   HOME EXERCISE PROGRAM: Access Code: [email or web address] URL: [email or web address] Date: 11/24/2021 Prepared by: BraClarene Essexxercises - Standing Shoulder Row with Anchored Resistance  - 1 x daily - 3 x weekly - 3 sets - 10 reps - Shoulder extension with resistance - Neutral  - 1 x daily - 3 x weekly - 3 sets - 10 reps - Shoulder External Rotation with Anchored Resistance  - 1 x  daily - 3 x weekly - 3 sets - 10  reps - Shoulder Internal Rotation with Resistance  - 1 x daily - 3 x weekly - 3 sets - 10 reps - Wall Clock with Theraband  - 1 x daily - 3 x weekly - 3 sets - 10 reps - Modified Posterior Capsule Stretch  - 1 x daily - 7 x weekly - 3 sets - 30 sec hold - Seated Upper Trapezius Stretch  - 1 x daily - 7 x weekly - 3 sets - 10 reps - Gentle Levator Scapulae Stretch  - 1 x daily - 7 x weekly - 3 sets - 30 sec hold   ASSESSMENT:  CLINICAL IMPRESSION: Continued to progress periscap strengthening and postural exercises today. Pt still is having pain with overhead movements but not as bad as before. He continues to have tightness throughout the scapula and posterior shoulder complex. Added neck stretches and posterior capsule to HEP to improve flexibility. Continue progressing exercises.   OBJECTIVE IMPAIRMENTS decreased mobility, decreased ROM, decreased strength, increased muscle spasms, impaired UE functional use, postural dysfunction, and pain.   ACTIVITY LIMITATIONS carrying, lifting, bending, sleeping, reach over head, and hygiene/grooming  PARTICIPATION LIMITATIONS: cleaning, driving, and occupation  PERSONAL FACTORS 1-2 comorbidities: GERD, RLS  are also affecting patient's functional outcome.   REHAB POTENTIAL: Excellent  CLINICAL DECISION MAKING: Stable/uncomplicated  EVALUATION COMPLEXITY: Low   GOALS: Goals reviewed with patient? Yes  SHORT TERM GOALS: Target date: 11/23/2021   Patient will be independent with initial HEP.  Baseline: given Goal status: MET - 11/17/21   LONG TERM GOALS: Target date: 12/21/2021   Patient will be independent with advanced/ongoing HEP to improve outcomes and carryover.  Baseline: needs progression Goal status: INITIAL  2.  Patient will report 75% improvement in Left shoulder pain to improve QOL.  Baseline: 6/10 overhead movements, at night; interrupting sleep Goal status: INITIAL  3.  Patient to improve Left shoulder AROM to WNL without  pain provocation to allow for increased ease of ADLs.  Baseline: see objective, painful arc 120 Goal status: INITIAL  4.  Patient will demonstrate improved functional UE strength as demonstrated by 5/5 L shoulder strength without pain. Baseline: see objective Goal status: INITIAL  5.  Patient will report <20% impairment on QuickDash  to demonstrate improved functional ability.  Baseline: 38.6% Goal status: INITIAL  6.  Patient will be able to sleep without interruption from L shoulder pain.    Baseline: frequently wakes due to pain Goal status: INITIAL   7. Patient will be able to lift 40lbs without increased pain for work activities.  Baseline:  Goal status: INITIAL    PLAN: PT FREQUENCY: 1-2x/week  PT DURATION: 6 weeks  PLANNED INTERVENTIONS: Therapeutic exercises, Therapeutic activity, Neuromuscular re-education, Balance training, Gait training, Patient/Family education, Self Care, Joint mobilization, Dry Needling, Electrical stimulation, Spinal mobilization, Cryotherapy, Moist heat, Taping, Vasopneumatic device, Ultrasound, Ionotophoresis 51m/ml Dexamethasone, Manual therapy, and Re-evaluation  PLAN FOR NEXT SESSION: check goals; facilitate posterior shoulder muscles; manual therapy, trial DN to pec, subscap, biceps, can also trial iontophoresis anterior shoulder.     BArtist Pais PTA 11/24/2021, 5:53 PM

## 2021-11-29 ENCOUNTER — Ambulatory Visit: Payer: BC Managed Care – PPO

## 2021-11-29 DIAGNOSIS — M25612 Stiffness of left shoulder, not elsewhere classified: Secondary | ICD-10-CM

## 2021-11-29 DIAGNOSIS — M25512 Pain in left shoulder: Secondary | ICD-10-CM | POA: Diagnosis not present

## 2021-11-29 DIAGNOSIS — R252 Cramp and spasm: Secondary | ICD-10-CM

## 2021-11-29 NOTE — Therapy (Signed)
OUTPATIENT PHYSICAL THERAPY TREATMENT   Patient Name: Tyler Stevens MRN: [id number] DOB:Aug 30, 1975, 46 y.o., male Today's Date: 11/29/2021   PT End of Session - 11/29/21 1756     Visit Number 6    Number of Visits 10    Date for PT Re-Evaluation 12/21/21    Authorization Type BCBS    PT Start Time 1705    PT Stop Time 8811    PT Time Calculation (min) 40 min    Activity Tolerance Patient tolerated treatment well    Behavior During Therapy [facility] for tasks assessed/performed                  Past Medical History:  Diagnosis Date   Chest pain in adult 05/07/2018   Lateral epicondylitis of left elbow 07/25/2017   RLS (restless legs syndrome) 07/25/2017   Past Surgical History:  Procedure Laterality Date   WISDOM TOOTH EXTRACTION     Patient Active Problem List   Diagnosis Date Noted   Gastroesophageal reflux disease 03/24/2021   Mixed hyperlipidemia 09/09/2020   Chronic heartburn 05/08/2018   Chest pain in adult 05/07/2018   RLS (restless legs syndrome) 07/25/2017   Lateral epicondylitis of left elbow 07/25/2017    PCP: Shelda Pal, DO   REFERRING PROVIDER: Shelda Pal, DO  REFERRING DIAG: [phone] (ICD-10-CM) - Acute pain of left shoulder  THERAPY DIAG:  Acute pain of left shoulder  Stiffness of left shoulder, not elsewhere classified  Cramp and spasm  Rationale for Evaluation and Treatment Rehabilitation  ONSET DATE: started 7-8 weeks ago  SUBJECTIVE:                                                                                                                                                                                      SUBJECTIVE STATEMENT: Shoulder is getting better, noticing it doesn't hurt as much with repetitive movements.   PERTINENT HISTORY: GERD, RLS  PAIN:  Are you having pain? No  PRECAUTIONS: None  WEIGHT BEARING RESTRICTIONS No  FALLS:  Has patient fallen in last 6 months? 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   OBJECTIVE:   DIAGNOSTIC FINDINGS:  None taken  PATIENT SURVEYS:  Quick Dash 38.6% disability   COGNITION:  Overall cognitive status: Within functional limits for tasks assessed     SENSATION: WFL  POSTURE: Slightly rounded shoulders.  UPPER EXTREMITY ROM:   Active ROM Right eval Left eval  Shoulder flexion 150 120*  Shoulder extension 75 50*  Shoulder abduction 155 140*  Shoulder adduction    Shoulder internal rotation To low back To L hip* (passive 55)  Shoulder external rotation To neck To neck* (passive 85 without pain)  (Blank rows = not tested)  UPPER EXTREMITY MMT:  MMT Right eval Left* eval  Shoulder flexion 5 5  Shoulder extension    Shoulder abduction 5 5  Shoulder adduction    Shoulder internal rotation 5 5  Shoulder external rotation 5 4+*  Elbow flexion 5 5  Elbow extension 5 5  Wrist flexion 5 5  Wrist extension 5 5  Grip strength (lbs) good good  (Blank rows = not tested)  SHOULDER SPECIAL TESTS:  Impingement tests: Painful arc test: positive    Rotator cuff assessment: Full can test: positive    JOINT MOBILITY TESTING:  No capsular tightness  PALPATION:  Tenderness anterior shoulder over biceps tendon, bursa, subscapularis attachment.    TODAY'S TREATMENT:  11/29/21 UBE L3 17f3b L UE shoulder extension x 10 blue TB  L UE shoulder lat pull x 10 blue TB Prayer AAROM flexion at wall x 10 Prone shoulder extension 2# x 10 Prone horiz ABD x 10 Prone scaption x 10 S/L L ER with 2# weight x 10  Manual Therapy: STM to L infraspinatus, teres group  11/24/21 UBE L3 31fb Standing high horiz ABD  red TB x 20 Standing shld ext green TB x 20 BATCA L UE lat pull 15# x 10 Standing ER green TB 5x 5 sec hold Standing prayer AAROM into flexion x 10 Modified post capsule stretch x 30 sec UT and levator x 30 sec both  Manual Therapy: STM to L infraspinatus, teres group, UT, LS - trigger points in every muscle  11/22/21 UBE L2 3 min fwd/ 3 min back Standing ER green TB 2x10 Standing L shld ext green TB 2x10 BATCA lat pull 20# 2x10 Standing L UE high horiz ABD x 10 red TB - difficulty kicking in persicap muscles TC for contraction  Manual Therapy: STM to L infraspinatus, LS, ant deltoid, proximal bicep attachment  11/17/21 TE: UBE L2 ([id number]) Doorway pec stretch low 2x30 sec Standing ER with green TB x 10 bil Standing IR green TB x 10 bil Standing serratus slide with foam roll x 10 Standing wall clocks with red TB x 5 Ball on wall CCW x 20 L UE Standing D1 flexion L UE x 10  11/11/21 Therapeutic Exercise: UBE L1.0 3 min fwd/3mi14mack Standing ER red TB isometric 10x 3 sec hold Wall push ups x 10 Rows with green TB x 10 Extensions with green TB x 10 Low pec stretch at doorway 2x30" - pain with higher shoulder positions Seated ER AROM with arm rested on mat table 10x   11/09/2021 Therapeutic Exercise: to improve strength and mobility.  Demo, verbal and tactile cues throughout for technique. Exercises - Seated Cervical Retraction - 10 reps - Seated Scapular Retraction  - 10 reps - 5 sec  hold - Seated Shoulder Rolls   - 10 reps - Standing Shoulder Row with Anchored Resistance  - 10 reps RTB - Shoulder extension with resistance - Neutral  - 10 reps RTB   PATIENT EDUCATION: Education details: HEP update Person educated: Patient Education method: Explanation, Demonstration, Verbal cues, and Handouts Education comprehension: verbalized understanding and returned demonstration   HOME EXERCISE PROGRAM: Access  Code: [email or web address] URL: [email or web address] Date:  11/24/2021 Prepared by: Clarene Essex  Exercises - Standing Shoulder Row with Anchored Resistance  - 1 x daily - 3 x weekly - 3 sets - 10 reps - Shoulder extension with resistance - Neutral  - 1 x daily - 3 x weekly - 3 sets - 10 reps - Shoulder External Rotation with Anchored Resistance  - 1 x daily - 3 x weekly - 3 sets - 10 reps - Shoulder Internal Rotation with Resistance  - 1 x daily - 3 x weekly - 3 sets - 10 reps - Wall Clock with Theraband  - 1 x daily - 3 x weekly - 3 sets - 10 reps - Modified Posterior Capsule Stretch  - 1 x daily - 7 x weekly - 3 sets - 30 sec hold - Seated Upper Trapezius Stretch  - 1 x daily - 7 x weekly - 3 sets - 10 reps - Gentle Levator Scapulae Stretch  - 1 x daily - 7 x weekly - 3 sets - 30 sec hold   ASSESSMENT:  CLINICAL IMPRESSION: Pt continues to demonstrate good response to treatment. Today he reports 40-50% improvement in overall L UE pain. He notes improved ability to reach to back pocket for phone and more endurance with repetitive work movements. Able to progress rows and ext to blue TB for HEP. Initiated prone persicap strengthening and S/L for more isolation. Cues were required to avoid pain with prone scaption. Still trigger points in L infraspinatus but not as much as last visit. Pt is progressing toward goals.   OBJECTIVE IMPAIRMENTS decreased mobility, decreased ROM, decreased strength, increased muscle spasms, impaired UE functional use, postural dysfunction, and pain.   ACTIVITY LIMITATIONS carrying, lifting, bending, sleeping, reach over head, and hygiene/grooming  PARTICIPATION LIMITATIONS: cleaning, driving, and occupation  PERSONAL FACTORS 1-2 comorbidities: GERD, RLS  are also affecting patient's functional outcome.   REHAB POTENTIAL: Excellent  CLINICAL DECISION MAKING: Stable/uncomplicated  EVALUATION COMPLEXITY: Low   GOALS: Goals reviewed with patient? Yes  SHORT TERM GOALS: Target date: 11/23/2021   Patient will be  independent with initial HEP.  Baseline: given Goal status: MET - 11/17/21   LONG TERM GOALS: Target date: 12/21/2021   Patient will be independent with advanced/ongoing HEP to improve outcomes and carryover.  Baseline: needs progression Goal status: IN PROGRESS  2.  Patient will report 75% improvement in Left shoulder pain to improve QOL.  Baseline: 6/10 overhead movements, at night; interrupting sleep Goal status: IN PROGRESS - 11/29/21 40-50% improvement   3.  Patient to improve Left shoulder AROM to WNL without pain provocation to allow for increased ease of ADLs.  Baseline: see objective, painful arc 120 Goal status: IN PROGRESS  4.  Patient will demonstrate improved functional UE strength as demonstrated by 5/5 L shoulder strength without pain. Baseline: see objective Goal status: IN PROGRESS  5.  Patient will report <20% impairment on QuickDash  to demonstrate improved functional ability.  Baseline: 38.6% Goal status: IN PROGRESS  6.  Patient will be able to sleep without interruption from L shoulder pain.    Baseline: frequently wakes due to pain Goal status: IN PROGRESS   7. Patient will be able to lift 40lbs without increased pain for work activities.  Baseline:  Goal status: IN PROGRESS    PLAN: PT FREQUENCY: 1-2x/week  PT DURATION: 6 weeks  PLANNED INTERVENTIONS: Therapeutic exercises, Therapeutic activity, Neuromuscular re-education, Balance training, Gait training, Patient/Family education, Self Care, Joint mobilization, Dry  Needling, Electrical stimulation, Spinal mobilization, Cryotherapy, Moist heat, Taping, Vasopneumatic device, Ultrasound, Ionotophoresis 68m/ml Dexamethasone, Manual therapy, and Re-evaluation  PLAN FOR NEXT SESSION:  facilitate posterior shoulder muscles; manual therapy, trial DN to pec, subscap, biceps, can also trial iontophoresis anterior shoulder.     BArtist Pais PTA 11/29/2021, 5:57 PM

## 2021-12-02 ENCOUNTER — Ambulatory Visit: Payer: BC Managed Care – PPO | Admitting: Physical Therapy

## 2021-12-02 ENCOUNTER — Encounter: Payer: Self-pay | Admitting: Physical Therapy

## 2021-12-02 DIAGNOSIS — R252 Cramp and spasm: Secondary | ICD-10-CM

## 2021-12-02 DIAGNOSIS — M25512 Pain in left shoulder: Secondary | ICD-10-CM

## 2021-12-02 DIAGNOSIS — M25612 Stiffness of left shoulder, not elsewhere classified: Secondary | ICD-10-CM

## 2021-12-02 NOTE — Patient Instructions (Signed)

## 2021-12-02 NOTE — Therapy (Addendum)
OUTPATIENT PHYSICAL THERAPY TREATMENT   Patient Name: Tyler Stevens MRN: [id number] DOB:1975-05-11, 46 y.o., male Today's Date: 12/02/2021   PT End of Session - 12/02/21 1701     Visit Number 7    Number of Visits 10    Date for PT Re-Evaluation 12/21/21    Authorization Type BCBS    PT Start Time 1700    PT Stop Time 2836    PT Time Calculation (min) 47 min    Activity Tolerance Patient tolerated treatment well    Behavior During Therapy [facility] for tasks assessed/performed                  Past Medical History:  Diagnosis Date   Chest pain in adult 05/07/2018   Lateral epicondylitis of left elbow 07/25/2017   RLS (restless legs syndrome) 07/25/2017   Past Surgical History:  Procedure Laterality Date   WISDOM TOOTH EXTRACTION     Patient Active Problem List   Diagnosis Date Noted   Gastroesophageal reflux disease 03/24/2021   Mixed hyperlipidemia 09/09/2020   Chronic heartburn 05/08/2018   Chest pain in adult 05/07/2018   RLS (restless legs syndrome) 07/25/2017   Lateral epicondylitis of left elbow 07/25/2017    PCP: Shelda Pal, DO   REFERRING PROVIDER: Shelda Pal, DO  REFERRING DIAG: [phone] (ICD-10-CM) - Acute pain of left shoulder  THERAPY DIAG:  Acute pain of left shoulder  Stiffness of left shoulder, not elsewhere classified  Cramp and spasm  Rationale for Evaluation and Treatment Rehabilitation  ONSET DATE: started 7-8 weeks ago  SUBJECTIVE:                                                                                                                                                                                      SUBJECTIVE STATEMENT: Shoulder is getting better, but did work today so hurting.   PERTINENT HISTORY: GERD, RLS  PAIN:  Are you having pain? Yes: NPRS scale: 4-5/10 Pain location: L shoulder  PRECAUTIONS: None  WEIGHT BEARING RESTRICTIONS No  FALLS:  Has patient fallen in last 6 months?  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   OBJECTIVE:   DIAGNOSTIC FINDINGS:  None taken  PATIENT SURVEYS:  Quick Dash 38.6% disability   COGNITION:  Overall cognitive status: Within functional limits for tasks assessed     SENSATION: WFL  POSTURE:  Slightly rounded shoulders.    UPPER EXTREMITY ROM:   Active ROM Right eval Left eval  Shoulder flexion 150 120*  Shoulder extension 75 50*  Shoulder abduction 155 140*  Shoulder adduction    Shoulder internal rotation To low back To L hip* (passive 55)  Shoulder external rotation To neck To neck* (passive 85 without pain)  (Blank rows = not tested)  UPPER EXTREMITY MMT:  MMT Right eval Left* eval  Shoulder flexion 5 5  Shoulder extension    Shoulder abduction 5 5  Shoulder adduction    Shoulder internal rotation 5 5  Shoulder external rotation 5 4+*  Elbow flexion 5 5  Elbow extension 5 5  Wrist flexion 5 5  Wrist extension 5 5  Grip strength (lbs) good good  (Blank rows = not tested)  SHOULDER SPECIAL TESTS:  Impingement tests: Painful arc test: positive    Rotator cuff assessment: Full can test: positive    JOINT MOBILITY TESTING:  No capsular tightness  PALPATION:  Tenderness anterior shoulder over biceps tendon, bursa, subscapularis attachment.    TODAY'S TREATMENT:  12/02/2021 Therapeutic Exercise: to improve strength and mobility.  Demo, verbal and tactile cues throughout for technique. UBE L3 65f3b After manual therapy: Wall angels x 10 Bil shoulder ER - increased pain GTB, no pain with RTB x 10  Bil shoulder extension RTB x 20 Tapping second shelft with 2# weight - reported burning in  shoulder, no pain with unweighted shoulder raises.  Manual Therapy: to decrease muscle spasm and pain and improve mobility IASTM to L biceps, STM/TPR to biceps, infra and supraspinatus; skilled palpation and monitoring during dry needling. Trigger Point Dry-Needling  Treatment instructions: Expect mild to moderate muscle soreness. S/S of pneumothorax if dry needled over a lung field, and to seek immediate medical attention should they occur. Patient verbalized understanding of these instructions and education.  Patient Consent Given: Yes Education handout provided: Yes Muscles treated: L biceps, infraspinatus and supraspinatus Treatment response/outcome: Twitch Response Elicited and Palpable Increase in Muscle Length    11/29/21 UBE L3 318fb L UE shoulder extension x 10 blue TB  L UE shoulder lat pull x 10 blue TB Prayer AAROM flexion at wall x 10 Prone shoulder extension 2# x 10 Prone horiz ABD x 10 Prone scaption x 10 S/L L ER with 2# weight x 10  Manual Therapy: STM to L infraspinatus, teres group  11/24/21 UBE L3 4f14f Standing high horiz ABD red TB x 20 Standing shld ext green TB x 20 BATCA L UE lat pull 15# x 10 Standing ER green TB 5x 5 sec hold Standing prayer AAROM into flexion x 10 Modified post capsule stretch x 30 sec UT and levator x 30 sec both  Manual Therapy: STM to L infraspinatus, teres group, UT, LS - trigger points in every muscle  11/22/21 UBE L2 3 min fwd/ 3 min back Standing ER green TB 2x10 Standing L shld ext green TB 2x10 BATCA lat pull 20# 2x10 Standing L UE high horiz ABD x 10 red TB - difficulty kicking in persicap muscles TC for contraction  Manual Therapy: STM to L infraspinatus, LS, ant deltoid, proximal bicep attachment   PATIENT EDUCATION: Education details: HEP update 12/02/2021 Person educated: Patient Education method: Explanation, Demonstration, Verbal cues, and Handouts Education comprehension: verbalized understanding and  returned demonstration   HOME EXERCISE PROGRAM: Access Code: [email or web address] URL: [email or web address] Date: 12/02/2021 Prepared by: EliGlenetta Hewxercises - Standing Shoulder Row with Anchored Resistance  -  1 x daily - 3 x weekly - 3 sets - 10 reps - Shoulder extension with resistance - Neutral  - 1 x daily - 3 x weekly - 3 sets - 10 reps - Shoulder External Rotation with Anchored Resistance  - 1 x daily - 3 x weekly - 3 sets - 10 reps - Shoulder Internal Rotation with Resistance  - 1 x daily - 3 x weekly - 3 sets - 10 reps - Wall Clock with Theraband  - 1 x daily - 3 x weekly - 3 sets - 10 reps - Modified Posterior Capsule Stretch  - 1 x daily - 7 x weekly - 3 sets - 30 sec hold - Seated Upper Trapezius Stretch  - 1 x daily - 7 x weekly - 3 sets - 10 reps - Gentle Levator Scapulae Stretch  - 1 x daily - 7 x weekly - 3 sets - 30 sec hold - Wall Angels  - 1 x daily - 7 x weekly - 3 sets - 10 reps   ASSESSMENT:  CLINICAL IMPRESSION: Minas Mix reports that he still has R shoulder pain with overhead lifting, but the burning is less than before.  Noted trigger points and tightness still present in R shoulder, so after explanation of DN rational, procedures, outcomes and potential side effects, patient verbalized consent to DN treatment in conjunction with manual STM/DTM and TPR to reduce ttp/muscle tension. Muscles treated as indicated above. DN produced normal response with good twitches elicited resulting in palpable reduction in pain/ttp and muscle tension, with patient noting less pain upon initiation of movement following DN. Pt educated to expect mild to moderate muscle soreness for up to 24-48 hrs and instructed to continue prescribed home exercise program and current activity level with pt verbalizing understanding of theses instructions.   He was able to perform wall angels afterwards without pain, still some tightness, but lifting 2# weight still increased burning in  R shoulder.  Darren Cando continues to demonstrate potential for improvement and would benefit from continued skilled therapy to address impairments.       OBJECTIVE IMPAIRMENTS decreased mobility, decreased ROM, decreased strength, increased muscle spasms, impaired UE functional use, postural dysfunction, and pain.   ACTIVITY LIMITATIONS carrying, lifting, bending, sleeping, reach over head, and hygiene/grooming  PARTICIPATION LIMITATIONS: cleaning, driving, and occupation  PERSONAL FACTORS 1-2 comorbidities: GERD, RLS  are also affecting patient's functional outcome.   REHAB POTENTIAL: Excellent  CLINICAL DECISION MAKING: Stable/uncomplicated  EVALUATION COMPLEXITY: Low   GOALS: Goals reviewed with patient? Yes  SHORT TERM GOALS: Target date: 11/23/2021   Patient will be independent with initial HEP.  Baseline: given Goal status: MET - 11/17/21   LONG TERM GOALS: Target date: 12/21/2021   Patient will be independent with advanced/ongoing HEP to improve outcomes and carryover.  Baseline: needs progression Goal status: IN PROGRESS  2.  Patient will report 75% improvement in Left shoulder pain to improve QOL.  Baseline: 6/10 overhead movements, at night; interrupting sleep Goal status: IN PROGRESS - 11/29/21 40-50% improvement   3.  Patient to improve Left shoulder AROM to WNL without pain provocation to allow for increased ease of ADLs.  Baseline: see objective, painful arc 120 Goal status: IN PROGRESS  4.  Patient will demonstrate improved functional UE strength as demonstrated by 5/5 L shoulder strength without pain. Baseline: see objective Goal status: IN PROGRESS  5.  Patient will report <20% impairment on QuickDash  to demonstrate improved functional ability.  Baseline: 38.6%  Goal status: IN PROGRESS  6.  Patient will be able to sleep without interruption from L shoulder pain.    Baseline: frequently wakes due to pain Goal status: IN PROGRESS   7. Patient  will be able to lift 40lbs without increased pain for work activities.  Baseline:  Goal status: IN PROGRESS    PLAN: PT FREQUENCY: 1-2x/week  PT DURATION: 6 weeks  PLANNED INTERVENTIONS: Therapeutic exercises, Therapeutic activity, Neuromuscular re-education, Balance training, Gait training, Patient/Family education, Self Care, Joint mobilization, Dry Needling, Electrical stimulation, Spinal mobilization, Cryotherapy, Moist heat, Taping, Vasopneumatic device, Ultrasound, Ionotophoresis 45m/ml Dexamethasone, Manual therapy, and Re-evaluation  PLAN FOR NEXT SESSION:  facilitate posterior shoulder muscles; manual therapy, trial DN to pec, subscap, biceps, can also trial iontophoresis anterior shoulder.     ERennie Natter PT, DPT 12/02/2021, 5:56 PM

## 2021-12-06 ENCOUNTER — Encounter: Payer: Self-pay | Admitting: Physical Therapy

## 2021-12-06 ENCOUNTER — Ambulatory Visit: Payer: BC Managed Care – PPO | Admitting: Physical Therapy

## 2021-12-06 DIAGNOSIS — M25512 Pain in left shoulder: Secondary | ICD-10-CM

## 2021-12-06 DIAGNOSIS — M25612 Stiffness of left shoulder, not elsewhere classified: Secondary | ICD-10-CM

## 2021-12-06 DIAGNOSIS — R252 Cramp and spasm: Secondary | ICD-10-CM

## 2021-12-06 NOTE — Therapy (Addendum)
OUTPATIENT PHYSICAL THERAPY TREATMENT   Patient Name: Tyler Stevens MRN: [id number] DOB:December 23, 1975, 46 y.o., male Today's Date: 12/06/2021   PT End of Session - 12/06/21 1705     Visit Number 8    Number of Visits 10    Date for PT Re-Evaluation 12/21/21    Authorization Type BCBS    PT Start Time 1703    PT Stop Time 1750    PT Time Calculation (min) 47 min    Activity Tolerance Patient tolerated treatment well    Behavior During Therapy [facility] for tasks assessed/performed                  Past Medical History:  Diagnosis Date   Chest pain in adult 05/07/2018   Lateral epicondylitis of left elbow 07/25/2017   RLS (restless legs syndrome) 07/25/2017   Past Surgical History:  Procedure Laterality Date   WISDOM TOOTH EXTRACTION     Patient Active Problem List   Diagnosis Date Noted   Gastroesophageal reflux disease 03/24/2021   Mixed hyperlipidemia 09/09/2020   Chronic heartburn 05/08/2018   Chest pain in adult 05/07/2018   RLS (restless legs syndrome) 07/25/2017   Lateral epicondylitis of left elbow 07/25/2017    PCP: Shelda Pal, DO   REFERRING PROVIDER: Shelda Pal, DO  REFERRING DIAG: [phone] (ICD-10-CM) - Acute pain of left shoulder  THERAPY DIAG:  Acute pain of left shoulder  Stiffness of left shoulder, not elsewhere classified  Cramp and spasm  Rationale for Evaluation and Treatment Rehabilitation  ONSET DATE: started 7-8 weeks ago  SUBJECTIVE:                                                                                                                                                                                      SUBJECTIVE STATEMENT: "So much better today, I want more of that magic needle."  PERTINENT HISTORY: GERD, RLS  PAIN:  Are you having pain? Yes: NPRS scale: 2-3/10 Pain location: L shoulder, over biceps tendon  PRECAUTIONS: None  WEIGHT BEARING RESTRICTIONS No  FALLS:  Has patient fallen in  last 6 months? 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   OBJECTIVE:   DIAGNOSTIC FINDINGS:  None taken  PATIENT SURVEYS:  Quick Dash 38.6% disability   COGNITION:  Overall cognitive status: Within functional limits for tasks assessed     SENSATION:  WFL  POSTURE: Slightly rounded shoulders.    UPPER EXTREMITY ROM:   Active ROM Right eval Left eval  Shoulder flexion 150 120*  Shoulder extension 75 50*  Shoulder abduction 155 140*  Shoulder adduction    Shoulder internal rotation To low back To L hip* (passive 55)  Shoulder external rotation To neck To neck* (passive 85 without pain)  (Blank rows = not tested)  UPPER EXTREMITY MMT:  MMT Right eval Left* eval  Shoulder flexion 5 5  Shoulder extension    Shoulder abduction 5 5  Shoulder adduction    Shoulder internal rotation 5 5  Shoulder external rotation 5 4+*  Elbow flexion 5 5  Elbow extension 5 5  Wrist flexion 5 5  Wrist extension 5 5  Grip strength (lbs) good good  (Blank rows = not tested)  SHOULDER SPECIAL TESTS:  Impingement tests: Painful arc test: positive    Rotator cuff assessment: Full can test: positive    JOINT MOBILITY TESTING:  No capsular tightness  PALPATION:  Tenderness anterior shoulder over biceps tendon, bursa, subscapularis attachment.    TODAY'S TREATMENT:  12/06/2021 Therapeutic Exercise: to improve strength and mobility.  Demo, verbal and tactile cues throughout for technique. UBE L4 x 6 min 89f3b Shoulder flexion on wall x 10  Wall angels x 10  Reaching up to 2nd shelf x 10 - no pain.  Manual Therapy: to decrease muscle spasm and pain and improve mobility  STM/TPR  to biceps, pectoralis; skilled palpation and monitoring during dry needling. Trigger Point Dry-Needling  Treatment instructions: Expect mild to moderate muscle soreness. S/S of pneumothorax if dry needled over a lung field, and to seek immediate medical attention should they occur. Patient verbalized understanding of these instructions and education.  Patient Consent Given: Yes Education handout provided: Previously provided Muscles treated: L biceps, pectoralis major and minor Treatment response/outcome: Twitch Response Elicited and Palpable Increase in Muscle Length Modalities: UKoreax 8 min 1 MHz 1.2 w/cm2 over L biceps tendon to decrease inflammation and pain.   12/02/2021 Therapeutic Exercise: to improve strength and mobility.  Demo, verbal and tactile cues throughout for technique. UBE L3 348fb After manual therapy: Wall angels x 10 Bil shoulder ER - increased pain GTB, no pain with RTB x 10  Bil shoulder extension RTB x 20 Tapping second shelft with 2# weight - reported burning in shoulder, no pain with unweighted shoulder raises.  Manual Therapy: to decrease muscle spasm and pain and improve mobility IASTM to L biceps, STM/TPR to biceps, infra and supraspinatus; skilled palpation and monitoring during dry needling. Trigger Point Dry-Needling  Treatment instructions: Expect mild to moderate muscle soreness. S/S of pneumothorax if dry needled over a lung field, and to seek immediate medical attention should they occur. Patient verbalized understanding of these instructions and education.  Patient Consent Given: Yes Education handout provided: Yes Muscles treated: L biceps, infraspinatus and supraspinatus Treatment response/outcome: Twitch Response Elicited and Palpable Increase in Muscle Length    11/29/21 UBE L3 32f27f L UE shoulder extension x 10 blue TB  L UE shoulder lat pull x 10 blue TB Prayer AAROM flexion at wall x 10 Prone shoulder extension 2# x 10 Prone horiz ABD x  10 Prone scaption x 10 S/L L ER with 2# weight x 10  Manual Therapy: STM to L infraspinatus, teres group  PATIENT EDUCATION: Education details: HEP update 12/02/2021 Person educated: Patient Education method: Explanation, Demonstration, Verbal cues, and Handouts Education comprehension: verbalized understanding and returned  demonstration   HOME EXERCISE PROGRAM: Access Code: [email or web address] URL: [email or web address] Date: 12/02/2021 Prepared by: Glenetta Hew    ASSESSMENT:  CLINICAL IMPRESSION: Axavier Fiske reports significant improvement in L shoulder pain following TrDN last session.  He still reports tenderness anterior shoulder and over biceps tendon, continued focus on this region, added [place] to end of session to also decrease inflammation.  Able to raise arm much higher without pain today.   Gabrian Muise continues to demonstrate potential for improvement and would benefit from continued skilled therapy to address impairments.       OBJECTIVE IMPAIRMENTS decreased mobility, decreased ROM, decreased strength, increased muscle spasms, impaired UE functional use, postural dysfunction, and pain.   ACTIVITY LIMITATIONS carrying, lifting, bending, sleeping, reach over head, and hygiene/grooming  PARTICIPATION LIMITATIONS: cleaning, driving, and occupation  PERSONAL FACTORS 1-2 comorbidities: GERD, RLS  are also affecting patient's functional outcome.   REHAB POTENTIAL: Excellent  CLINICAL DECISION MAKING: Stable/uncomplicated  EVALUATION COMPLEXITY: Low   GOALS: Goals reviewed with patient? Yes  SHORT TERM GOALS: Target date: 11/23/2021   Patient will be independent with initial HEP.  Baseline: given Goal status: MET - 11/17/21   LONG TERM GOALS: Target date: 12/21/2021   Patient will be independent with advanced/ongoing HEP to improve outcomes and carryover.  Baseline: needs progression Goal status: IN PROGRESS  2.  Patient will report 75%  improvement in Left shoulder pain to improve QOL.  Baseline: 6/10 overhead movements, at night; interrupting sleep Goal status: IN PROGRESS - 11/29/21 40-50% improvement   3.  Patient to improve Left shoulder AROM to WNL without pain provocation to allow for increased ease of ADLs.  Baseline: see objective, painful arc 120 Goal status: IN PROGRESS  4.  Patient will demonstrate improved functional UE strength as demonstrated by 5/5 L shoulder strength without pain. Baseline: see objective Goal status: IN PROGRESS  5.  Patient will report <20% impairment on QuickDash  to demonstrate improved functional ability.  Baseline: 38.6% Goal status: IN PROGRESS  6.  Patient will be able to sleep without interruption from L shoulder pain.    Baseline: frequently wakes due to pain Goal status: IN PROGRESS   7. Patient will be able to lift 40lbs without increased pain for work activities.  Baseline:  Goal status: IN PROGRESS    PLAN: PT FREQUENCY: 1-2x/week  PT DURATION: 6 weeks  PLANNED INTERVENTIONS: Therapeutic exercises, Therapeutic activity, Neuromuscular re-education, Balance training, Gait training, Patient/Family education, Self Care, Joint mobilization, Dry Needling, Electrical stimulation, Spinal mobilization, Cryotherapy, Moist heat, Taping, Vasopneumatic device, Ultrasound, Ionotophoresis 33m/ml Dexamethasone, Manual therapy, and Re-evaluation  PLAN FOR NEXT SESSION:  facilitate posterior shoulder muscles; manual therapy, trial DN to pec, subscap, biceps, can also trial iontophoresis anterior shoulder.     ERennie Natter PT, DPT 12/06/2021, 5:59 PM

## 2021-12-08 ENCOUNTER — Ambulatory Visit: Payer: BC Managed Care – PPO

## 2021-12-08 DIAGNOSIS — M25512 Pain in left shoulder: Secondary | ICD-10-CM

## 2021-12-08 DIAGNOSIS — R252 Cramp and spasm: Secondary | ICD-10-CM

## 2021-12-08 DIAGNOSIS — M25612 Stiffness of left shoulder, not elsewhere classified: Secondary | ICD-10-CM

## 2021-12-08 NOTE — Therapy (Addendum)
OUTPATIENT PHYSICAL THERAPY TREATMENT   Patient Name: Tyler Stevens MRN: [id number] DOB:Nov 24, 1975, 46 y.o., male Today's Date: 12/08/2021   PT End of Session - 12/08/21 0845     Visit Number 9    Number of Visits 10    Date for PT Re-Evaluation 12/21/21    Authorization Type BCBS    PT Start Time 0801    PT Stop Time 0843    PT Time Calculation (min) 42 min    Activity Tolerance Patient tolerated treatment well    Behavior During Therapy [facility] for tasks assessed/performed                   Past Medical History:  Diagnosis Date   Chest pain in adult 05/07/2018   Lateral epicondylitis of left elbow 07/25/2017   RLS (restless legs syndrome) 07/25/2017   Past Surgical History:  Procedure Laterality Date   WISDOM TOOTH EXTRACTION     Patient Active Problem List   Diagnosis Date Noted   Gastroesophageal reflux disease 03/24/2021   Mixed hyperlipidemia 09/09/2020   Chronic heartburn 05/08/2018   Chest pain in adult 05/07/2018   RLS (restless legs syndrome) 07/25/2017   Lateral epicondylitis of left elbow 07/25/2017    PCP: Shelda Pal, DO   REFERRING PROVIDER: Shelda Pal, DO  REFERRING DIAG: [phone] (ICD-10-CM) - Acute pain of left shoulder  THERAPY DIAG:  Acute pain of left shoulder  Stiffness of left shoulder, not elsewhere classified  Cramp and spasm  Rationale for Evaluation and Treatment Rehabilitation  ONSET DATE: started 7-8 weeks ago  SUBJECTIVE:                                                                                                                                                                                      SUBJECTIVE STATEMENT: Pt reports that early in the morning is the best time for his shoulder, although he did have a knot he noticed in his bicep.  PERTINENT HISTORY: GERD, RLS  PAIN:  Are you having pain? No  PRECAUTIONS: None  WEIGHT BEARING RESTRICTIONS No  FALLS:  Has patient fallen in  last 6 months? 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   OBJECTIVE:   DIAGNOSTIC FINDINGS:  None taken  PATIENT SURVEYS:  Quick Dash 38.6% disability   COGNITION:  Overall cognitive status: Within functional limits for tasks assessed  SENSATION: WFL  POSTURE: Slightly rounded shoulders.    UPPER EXTREMITY ROM:   Active ROM Right eval Left eval  Shoulder flexion 150 120*  Shoulder extension 75 50*  Shoulder abduction 155 140*  Shoulder adduction    Shoulder internal rotation To low back To L hip* (passive 55)  Shoulder external rotation To neck To neck* (passive 85 without pain)  (Blank rows = not tested)  UPPER EXTREMITY MMT:  MMT Right eval Left* eval  Shoulder flexion 5 5  Shoulder extension    Shoulder abduction 5 5  Shoulder adduction    Shoulder internal rotation 5 5  Shoulder external rotation 5 4+*  Elbow flexion 5 5  Elbow extension 5 5  Wrist flexion 5 5  Wrist extension 5 5  Grip strength (lbs) good good  (Blank rows = not tested)  SHOULDER SPECIAL TESTS:  Impingement tests: Painful arc test: positive    Rotator cuff assessment: Full can test: positive    JOINT MOBILITY TESTING:  No capsular tightness  PALPATION:  Tenderness anterior shoulder over biceps tendon, bursa, subscapularis attachment.    TODAY'S TREATMENT:  12/08/21 TherEx: UBE L3 x 87mn 31fb Shoulder flexion on wall x12 (prayer exercise) Wall angels x 12 Seated horiz ABD red TB x 15 LUE horiz ABD red TB standing x 10 Doorway pec stretch mid 2 x 30 sec  Manual Therapy: STM to L proximal biceps with TPR  Modalities: [place] 5 min 1 MHz 1.2 w/cm2 over L biceps  tendon to decrease inflammation and pain.   12/06/2021 Therapeutic Exercise: to improve strength and mobility.  Demo, verbal and tactile cues throughout for technique. UBE L4 x 6 min 25f74f Shoulder flexion on wall x 10  Wall angels x 10  Reaching up to 2nd shelf x 10 - no pain.  Manual Therapy: to decrease muscle spasm and pain and improve mobility  STM/TPR to biceps, pectoralis; skilled palpation and monitoring during dry needling. Trigger Point Dry-Needling  Treatment instructions: Expect mild to moderate muscle soreness. S/S of pneumothorax if dry needled over a lung field, and to seek immediate medical attention should they occur. Patient verbalized understanding of these instructions and education.  Patient Consent Given: Yes Education handout provided: Previously provided Muscles treated: L biceps, pectoralis major and minor Treatment response/outcome: Twitch Response Elicited and Palpable Increase in Muscle Length Modalities: [place] min 1 MHz 1.2 w/cm2 over L biceps tendon to decrease inflammation and pain.   12/02/2021 Therapeutic Exercise: to improve strength and mobility.  Demo, verbal and tactile cues throughout for technique. UBE L3 25f/7fAfter manual therapy: Wall angels x 10 Bil shoulder ER - increased pain GTB, no pain with RTB x 10  Bil shoulder extension RTB x 20 Tapping second shelft with 2# weight - reported burning in shoulder, no pain with unweighted shoulder raises.  Manual Therapy: to decrease muscle spasm and pain and improve mobility IASTM to L biceps, STM/TPR to biceps, infra and supraspinatus; skilled palpation and monitoring during dry needling. Trigger Point Dry-Needling  Treatment instructions: Expect mild to moderate muscle soreness. S/S of pneumothorax if dry needled over a lung field, and to seek immediate medical attention should they occur. Patient verbalized understanding of these instructions and education.  Patient Consent Given: Yes Education  handout provided: Yes Muscles treated: L biceps, infraspinatus and supraspinatus Treatment response/outcome: Twitch Response Elicited and Palpable Increase in Muscle Length    11/29/21 UBE L3 25f/380f UE shoulder extension x 10 blue TB  L UE shoulder lat pull x 10 blue TB Prayer AAROM flexion at wall x 10 Prone shoulder extension 2# x 10 Prone horiz ABD x 10 Prone scaption x 10 S/L L ER with 2# weight x 10  Manual Therapy: STM to L infraspinatus, teres group  PATIENT EDUCATION: Education details: HEP update 12/02/2021 Person educated: Patient Education method: Explanation, Demonstration, Verbal cues, and Handouts Education comprehension: verbalized understanding and returned demonstration   HOME EXERCISE PROGRAM: Access Code: [email or web address] URL: [email or web address] Date: 12/02/2021 Prepared by: Glenetta Hew    ASSESSMENT:  CLINICAL IMPRESSION: Alaster is continuing to progress with PT. He is sleeping better w/o waking up d/t pain but still unable to fully sleep on L side. He had a big knot along his L biceps tendon which we worked out during [employer]. He became pretty fatigued with the scap stability exercises today. Cues were required to avoid arms behind body with horiz ABD. Concluded session with [place] to biceps tendon to reduce pain and inflammation.     OBJECTIVE IMPAIRMENTS decreased mobility, decreased ROM, decreased strength, increased muscle spasms, impaired UE functional use, postural dysfunction, and pain.   ACTIVITY LIMITATIONS carrying, lifting, bending, sleeping, reach over head, and hygiene/grooming  PARTICIPATION LIMITATIONS: cleaning, driving, and occupation  PERSONAL FACTORS 1-2 comorbidities: GERD, RLS  are also affecting patient's functional outcome.   REHAB POTENTIAL: Excellent  CLINICAL DECISION MAKING: Stable/uncomplicated  EVALUATION COMPLEXITY: Low   GOALS: Goals reviewed with patient? Yes  SHORT TERM GOALS: Target date: 11/23/2021    Patient will be independent with initial HEP.  Baseline: given Goal status: MET - 11/17/21   LONG TERM GOALS: Target date: 12/21/2021   Patient will be independent with advanced/ongoing HEP to improve outcomes and carryover.  Baseline: needs progression Goal status: IN PROGRESS  2.  Patient will report 75% improvement in Left shoulder pain to improve QOL.  Baseline: 6/10 overhead movements, at night; interrupting sleep Goal status: IN PROGRESS - 11/29/21 40-50% improvement   3.  Patient to improve Left shoulder AROM to WNL without pain provocation to allow for increased ease of ADLs.  Baseline: see objective, painful arc 120 Goal status: IN PROGRESS  4.  Patient will demonstrate improved functional UE strength as demonstrated by 5/5 L shoulder strength without pain. Baseline: see objective Goal status: IN PROGRESS  5.  Patient will report <20% impairment on QuickDash  to demonstrate improved functional ability.  Baseline: 38.6% Goal status: IN PROGRESS  6.  Patient will be able to sleep without interruption from L shoulder pain.    Baseline: frequently wakes due to pain Goal status: IN PROGRESS - 12/08/21 not waking up d/t pain anymore but cannot sleep on L side   7. Patient will be able to lift 40lbs without increased pain for work activities.  Baseline:  Goal status: IN PROGRESS    PLAN: PT FREQUENCY: 1-2x/week  PT DURATION: 6 weeks  PLANNED INTERVENTIONS: Therapeutic exercises, Therapeutic activity, Neuromuscular re-education, Balance training, Gait training, Patient/Family education, Self Care, Joint mobilization, Dry Needling, Electrical stimulation, Spinal mobilization, Cryotherapy, Moist heat, Taping, Vasopneumatic device, Ultrasound, Ionotophoresis 23m/ml Dexamethasone, Manual therapy, and Re-evaluation  PLAN FOR NEXT SESSION:  facilitate posterior shoulder muscles; manual therapy, trial DN to pec, subscap, biceps, can also trial iontophoresis anterior shoulder.      BArtist Pais PTA 12/08/2021, 8:45 AM

## 2021-12-15 ENCOUNTER — Encounter: Payer: Self-pay | Admitting: Physical Therapy

## 2021-12-15 ENCOUNTER — Ambulatory Visit: Payer: BC Managed Care – PPO | Attending: Family Medicine | Admitting: Physical Therapy

## 2021-12-15 DIAGNOSIS — M25512 Pain in left shoulder: Secondary | ICD-10-CM | POA: Insufficient documentation

## 2021-12-15 DIAGNOSIS — R252 Cramp and spasm: Secondary | ICD-10-CM | POA: Insufficient documentation

## 2021-12-15 DIAGNOSIS — M25612 Stiffness of left shoulder, not elsewhere classified: Secondary | ICD-10-CM | POA: Insufficient documentation

## 2021-12-15 NOTE — Therapy (Addendum)
OUTPATIENT PHYSICAL THERAPY TREATMENT PHYSICAL THERAPY DISCHARGE SUMMARY  Visits from Start of Care: 10  Current functional level related to goals / functional outcomes: Improved L shoulder pain and functional use   Remaining deficits: L shoulder pain with flexion (painful arc)   Education / Equipment: HEP  Plan:  Patient goals were not met. Patient is being discharged due to not returning to therapy after 12/15/2021 visit.    Rennie Natter, PT, DPT 9:20 AM 01/21/2022   Patient Name: Tyler Stevens MRN: [id number] DOB:1975-04-20, 46 y.o., male Today's Date: 12/15/2021   PT End of Session - 12/15/21 0804     Visit Number 10    Number of Visits 10    Date for PT Re-Evaluation 12/21/21    Authorization Type BCBS    PT Start Time 0802    PT Stop Time 0845    PT Time Calculation (min) 43 min    Activity Tolerance Patient tolerated treatment well    Behavior During Therapy [facility] for tasks assessed/performed                   Past Medical History:  Diagnosis Date   Chest pain in adult 05/07/2018   Lateral epicondylitis of left elbow 07/25/2017   RLS (restless legs syndrome) 07/25/2017   Past Surgical History:  Procedure Laterality Date   WISDOM TOOTH EXTRACTION     Patient Active Problem List   Diagnosis Date Noted   Gastroesophageal reflux disease 03/24/2021   Mixed hyperlipidemia 09/09/2020   Chronic heartburn 05/08/2018   Chest pain in adult 05/07/2018   RLS (restless legs syndrome) 07/25/2017   Lateral epicondylitis of left elbow 07/25/2017    PCP: Shelda Pal, DO   REFERRING PROVIDER: Shelda Pal, DO  REFERRING DIAG: [phone] (ICD-10-CM) - Acute pain of left shoulder  THERAPY DIAG:  Acute pain of left shoulder  Stiffness of left shoulder, not elsewhere classified  Cramp and spasm  Rationale for Evaluation and Treatment Rehabilitation  ONSET DATE: started 7-8 weeks ago  SUBJECTIVE:                                                                                                                                                                                       SUBJECTIVE STATEMENT: Monday 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   PERTINENT HISTORY: GERD, RLS  PAIN:  Are you having pain? No  PRECAUTIONS: None  WEIGHT BEARING RESTRICTIONS No  FALLS:  Has patient fallen in last 6 months? 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   OBJECTIVE:   DIAGNOSTIC FINDINGS:  None taken  PATIENT SURVEYS:  Quick Dash 38.6% disability   COGNITION:  Overall cognitive status: Within functional limits for tasks assessed     SENSATION: WFL  POSTURE: Slightly rounded shoulders.    UPPER EXTREMITY ROM:   Active ROM Right eval Left eval  Shoulder flexion 150 120*  Shoulder extension 75 50*  Shoulder abduction 155 140*  Shoulder adduction    Shoulder internal rotation To low back To L hip* (passive 55)  Shoulder external rotation To neck To neck* (passive 85 without pain)  (Blank rows = not tested)  UPPER EXTREMITY MMT:  MMT Right eval Left* eval  Shoulder flexion 5 5  Shoulder extension    Shoulder abduction 5 5  Shoulder adduction    Shoulder internal rotation 5 5  Shoulder external rotation 5 4+*  Elbow flexion 5 5  Elbow extension 5 5  Wrist flexion 5 5  Wrist extension 5 5  Grip strength (lbs) good good  (Blank rows = not tested)  SHOULDER SPECIAL TESTS:  Impingement tests: Painful arc test: positive    Rotator cuff assessment: Full can test: positive    JOINT MOBILITY  TESTING:  No capsular tightness  PALPATION:  Tenderness anterior shoulder over biceps tendon, bursa, subscapularis attachment.    TODAY'S TREATMENT:  12/15/2021 Therapeutic Exercise: to improve strength and mobility.  Demo, verbal and tactile cues throughout for technique. UBE L3 x 6 min 27f3b Sidelying ER with 2# weight x 8 Sidelying Abduction with 1# weight x 10 Sidelying Flexion - with arm elevated to more place of scapula - no resistance x 10 Manual Therapy: to decrease muscle spasm and pain and improve mobility STM/TPR to L biceps, deltoid; skilled palpation and monitoring during dry needling. Trigger Point Dry-Needling  Treatment instructions: Expect mild to moderate muscle soreness. S/S of pneumothorax if dry needled over a lung field, and to seek immediate medical attention should they occur. Patient verbalized understanding of these instructions and education.  Patient Consent Given: Yes Education handout provided: Previously provided Muscles treated: L biceps, L anterior deltoid Treatment response/outcome: Twitch Response Elicited and Palpable Increase in Muscle Length Modalities: [place] 8 min 1 MHz 1.2 w/cm2 over L biceps tendon to decrease inflammation and pain.   12/08/21 TherEx: UBE L3 x 62m 56f86f Shoulder flexion on wall x12 (prayer exercise) Wall angels x 12 Seated horiz ABD red TB x 15 LUE horiz ABD red TB standing x 10 Doorway pec stretch mid 2 x 30 sec  Manual Therapy: STM to L proximal biceps with TPR  Modalities: [place] min 1 MHz 1.2 w/cm2 over L biceps tendon to decrease inflammation and pain.   12/06/2021 Therapeutic Exercise: to improve strength and mobility.  Demo, verbal and tactile cues throughout for technique. UBE L4 x 6 min 56f/66fShoulder flexion on wall x 10  Wall angels x 10  Reaching up to 2nd shelf x 10 - no pain.  Manual Therapy: to decrease muscle spasm and pain and improve mobility  STM/TPR to biceps, pectoralis; skilled palpation and  monitoring during dry needling. Trigger Point Dry-Needling  Treatment instructions: Expect mild to moderate muscle soreness. S/S of pneumothorax if dry needled over  a lung field, and to seek immediate medical attention should they occur. Patient verbalized understanding of these instructions and education.  Patient Consent Given: Yes Education handout provided: Previously provided Muscles treated: L biceps, pectoralis major and minor Treatment response/outcome: Twitch Response Elicited and Palpable Increase in Muscle Length Modalities: [place] x 8 min 1 MHz 1.2 w/cm2 over L biceps tendon to decrease inflammation and pain.   12/02/2021 Therapeutic Exercise: to improve strength and mobility.  Demo, verbal and tactile cues throughout for technique. UBE L3 43f3b After manual therapy: Wall angels x 10 Bil shoulder ER - increased pain GTB, no pain with RTB x 10  Bil shoulder extension RTB x 20 Tapping second shelft with 2# weight - reported burning in shoulder, no pain with unweighted shoulder raises.  Manual Therapy: to decrease muscle spasm and pain and improve mobility IASTM to R biceps, STM/TPR to biceps, infra and supraspinatus; skilled palpation and monitoring during dry needling. Trigger Point Dry-Needling  Treatment instructions: Expect mild to moderate muscle soreness. S/S of pneumothorax if dry needled over a lung field, and to seek immediate medical attention should they occur. Patient verbalized understanding of these instructions and education.  Patient Consent Given: Yes Education handout provided: Yes Muscles treated: L biceps, infraspinatus and supraspinatus Treatment response/outcome: Twitch Response Elicited and Palpable Increase in Muscle Length   PATIENT EDUCATION: Education details: HEP update 12/02/2021, 12/15/21 Person educated: Patient Education method: Explanation, Demonstration, Verbal cues, and Handouts Education comprehension: verbalized understanding and returned  demonstration   HOME EXERCISE PROGRAM: Access Code: [email or web address] URL: [email or web address] Date: 12/02/2021 Prepared by: EGlenetta Hew   ASSESSMENT:  CLINICAL IMPRESSION: Tyler Stevens is making good progress but still has increased pain with shoulder flexion, even sidelying today.  Progressed HEP with sidelying exercises specific for shoulder impingement, to start with no or light resistance as tolerated, followed by manual therapy including TrDN and [place] decrease pain and inflammation to L shoulder.  Also recommended trying ice massage at end of day to decrease inflammation.  Tyler Stevens continues to demonstrate potential for improvement and would benefit from continued skilled therapy to address impairments.        OBJECTIVE IMPAIRMENTS decreased mobility, decreased ROM, decreased strength, increased muscle spasms, impaired UE functional use, postural dysfunction, and pain.   ACTIVITY LIMITATIONS carrying, lifting, bending, sleeping, reach over head, and hygiene/grooming  PARTICIPATION LIMITATIONS: cleaning, driving, and occupation  PERSONAL FACTORS 1-2 comorbidities: GERD, RLS  are also affecting patient's functional outcome.   REHAB POTENTIAL: Excellent  CLINICAL DECISION MAKING: Stable/uncomplicated  EVALUATION COMPLEXITY: Low   GOALS: Goals reviewed with patient? Yes  SHORT TERM GOALS: Target date: 11/23/2021   Patient will be independent with initial HEP.  Baseline: given Goal status: MET - 11/17/21   LONG TERM GOALS: Target date: 12/21/2021   Patient will be independent with advanced/ongoing HEP to improve outcomes and carryover.  Baseline: needs progression Goal status: IN PROGRESS  2.  Patient will report 75% improvement in Left shoulder pain to improve QOL.  Baseline: 6/10 overhead movements, at night; interrupting sleep Goal status: IN PROGRESS - 11/29/21 40-50% improvement   3.  Patient to improve Left shoulder AROM to WNL  without pain provocation to allow for increased ease of ADLs.  Baseline: see objective, painful arc 120 Goal status: IN PROGRESS  4.  Patient will demonstrate improved functional UE strength as demonstrated by 5/5 L shoulder strength without pain. Baseline: see objective Goal status: IN PROGRESS  5.  Patient will report <20% impairment  on QuickDash  to demonstrate improved functional ability.  Baseline: 38.6% Goal status: IN PROGRESS  6.  Patient will be able to sleep without interruption from L shoulder pain.    Baseline: frequently wakes due to pain Goal status: IN PROGRESS - 12/08/21 not waking up d/t pain anymore but cannot sleep on L side   7. Patient will be able to lift 40lbs without increased pain for work activities.  Baseline:  Goal status: IN PROGRESS    PLAN: PT FREQUENCY: 1-2x/week  PT DURATION: 6 weeks  PLANNED INTERVENTIONS: Therapeutic exercises, Therapeutic activity, Neuromuscular re-education, Balance training, Gait training, Patient/Family education, Self Care, Joint mobilization, Dry Needling, Electrical stimulation, Spinal mobilization, Cryotherapy, Moist heat, Taping, Vasopneumatic device, Ultrasound, Ionotophoresis 53m/ml Dexamethasone, Manual therapy, and Re-evaluation  PLAN FOR NEXT SESSION:  continue strengthening, modalities PRN     ERennie Natter PT, DPT  12/15/2021, 9:51 AM

## 2022-01-18 ENCOUNTER — Other Ambulatory Visit: Payer: Self-pay | Admitting: Family Medicine

## 2022-03-22 ENCOUNTER — Other Ambulatory Visit: Payer: Self-pay | Admitting: Family Medicine

## 2022-03-22 ENCOUNTER — Ambulatory Visit (INDEPENDENT_AMBULATORY_CARE_PROVIDER_SITE_OTHER): Payer: BC Managed Care – PPO | Admitting: Family Medicine

## 2022-03-22 ENCOUNTER — Encounter: Payer: Self-pay | Admitting: Family Medicine

## 2022-03-22 VITALS — BP 110/68 | HR 50 | Temp 98.0°F | Ht 70.5 in | Wt 235.4 lb

## 2022-03-22 DIAGNOSIS — E782 Mixed hyperlipidemia: Secondary | ICD-10-CM

## 2022-03-22 DIAGNOSIS — Z0000 Encounter for general adult medical examination without abnormal findings: Secondary | ICD-10-CM | POA: Diagnosis not present

## 2022-03-22 LAB — COMPREHENSIVE METABOLIC PANEL
ALT: 27 U/L (ref 0–53)
AST: 23 U/L (ref 0–37)
Albumin: 4.5 g/dL (ref 3.5–5.2)
Alkaline Phosphatase: 59 U/L (ref 39–117)
BUN: 13 mg/dL (ref 6–23)
CO2: 28 mEq/L (ref 19–32)
Calcium: 9.5 mg/dL (ref 8.4–10.5)
Chloride: 105 mEq/L (ref 96–112)
Creatinine, Ser: 1.04 mg/dL (ref 0.40–1.50)
GFR: 87.62 mL/min (ref >60.00)
Glucose, Bld: 90 mg/dL (ref 70–99)
Potassium: 4.4 mEq/L (ref 3.5–5.1)
Sodium: 141 mEq/L (ref 135–145)
Total Bilirubin: 0.7 mg/dL (ref 0.2–1.2)
Total Protein: 7 g/dL (ref 6.0–8.3)

## 2022-03-22 LAB — CBC
HCT: 45.3 % (ref 39.0–52.0)
Hemoglobin: 14.9 g/dL (ref 13.0–17.0)
MCHC: 32.9 g/dL (ref 30.0–36.0)
MCV: 84.4 fl (ref 78.0–100.0)
Platelets: 402 10*3/uL — ABNORMAL HIGH (ref 150.0–400.0)
RBC: 5.36 Mil/uL (ref 4.22–5.81)
RDW: 13.2 % (ref 11.5–15.5)
WBC: 7.8 10*3/uL (ref 4.0–10.5)

## 2022-03-22 LAB — LIPID PANEL
Cholesterol: 175 mg/dL (ref 0–200)
HDL: 33.1 mg/dL — ABNORMAL LOW (ref >39.00)
LDL Cholesterol: 106 mg/dL — ABNORMAL HIGH (ref 0–99)
NonHDL: 142.13
Total CHOL/HDL Ratio: 5
Triglycerides: 179 mg/dL — ABNORMAL HIGH (ref 0.0–149.0)
VLDL: 35.8 mg/dL (ref 0.0–40.0)

## 2022-03-22 MED ORDER — PRAMIPEXOLE DIHYDROCHLORIDE 1 MG PO TABS: 1.0000 mg | ORAL_TABLET | Freq: Every evening | ORAL | 3 refills | Status: DC

## 2022-03-22 MED ORDER — ROSUVASTATIN CALCIUM 20 MG PO TABS: 40.0000 mg | ORAL_TABLET | Freq: Every day | ORAL | 3 refills | Status: DC

## 2022-03-22 MED ORDER — ROSUVASTATIN CALCIUM 20 MG PO TABS: 20.0000 mg | ORAL_TABLET | Freq: Every day | ORAL | 3 refills | Status: DC

## 2022-03-22 NOTE — Patient Instructions (Signed)
Give us 2-3 business days to get the results of your labs back.   Keep the diet clean and stay active.  Please get me a copy of your advanced directive form at your convenience.   Let us know if you need anything.  

## 2022-03-22 NOTE — Progress Notes (Signed)
Chief Complaint  Patient presents with   Annual Exam    Well Male Tyler Stevens is here for a complete physical.   His last physical was >1 year ago.  Current diet: in general, diet is fair.   Current exercise: disc golf Weight trend: stable Fatigue out of ordinary? No. Seat belt? Yes.   Advanced directive? No  Health maintenance Tetanus- Yes HIV- Yes Hep C- Yes  Past Medical History:  Diagnosis Date   Chest pain in adult 05/07/2018   Lateral epicondylitis of left elbow 07/25/2017   RLS (restless legs syndrome) 07/25/2017     Past Surgical History:  Procedure Laterality Date   WISDOM TOOTH EXTRACTION      Medications  Current Outpatient Medications on File Prior to Visit  Medication Sig Dispense Refill   meloxicam (MOBIC) 15 MG tablet Take 1 tablet (15 mg total) by mouth daily. 30 tablet 0   pantoprazole (PROTONIX) 40 MG tablet Take 1 tablet by mouth once daily 90 tablet 0   pramipexole (MIRAPEX) 1 MG tablet Take 1 tablet (1 mg total) by mouth at bedtime. 90 tablet 2   rosuvastatin (CRESTOR) 20 MG tablet Take 1 tablet (20 mg total) by mouth daily. 90 tablet 3    Allergies No Known Allergies  Family History Family History  Problem Relation Age of Onset   Healthy Mother    Heart Problems Father    Arrhythmia Father    Cancer Neg Hx     Review of Systems: Constitutional: no fevers or chills Eye:  no recent significant change in vision Ear/Nose/Mouth/Throat:  Ears:  no hearing loss Nose/Mouth/Throat:  no complaints of nasal congestion, no sore throat Cardiovascular:  no chest pain Respiratory:  no shortness of breath Gastrointestinal:  no abdominal pain, no change in bowel habits GU:  Male: negative for dysuria, frequency, and incontinence Musculoskeletal/Extremities:  no pain of the joints Integumentary (Skin/Breast):  no abnormal skin lesions reported Neurologic:  no headaches Endocrine: No unexpected weight changes Hematologic/Lymphatic:  no night  sweats  Exam BP 110/68 (BP Location: Left Arm, Patient Position: Sitting, Cuff Size: Normal)   Pulse (!) 50   Temp 98 F (36.7 C) (Oral)   Ht 5' 10.5" (1.791 m)   Wt 235 lb 6 oz (106.8 kg)   SpO2 98%   BMI 33.30 kg/m  General:  well developed, well nourished, in no apparent distress Skin:  no significant moles, warts, or growths Head:  no masses, lesions, or tenderness Eyes:  pupils equal and round, sclera anicteric without injection Ears:  canals without lesions, TMs shiny without retraction, no obvious effusion, no erythema Nose:  nares patent, mucosa normal Throat/Pharynx:  lips and gingiva without lesion; tongue and uvula midline; non-inflamed pharynx; no exudates or postnasal drainage Neck: neck supple without adenopathy, thyromegaly, or masses Lungs:  clear to auscultation, breath sounds equal bilaterally, no respiratory distress Cardio:  regular rate and rhythm, no bruits, no LE edema Abdomen:  abdomen soft, nontender; bowel sounds normal; no masses or organomegaly Rectal: Deferred Musculoskeletal:  symmetrical muscle groups noted without atrophy or deformity Extremities:  no clubbing, cyanosis, or edema, no deformities, no skin discoloration Neuro:  gait normal; deep tendon reflexes normal and symmetric Psych: well oriented with normal range of affect and appropriate judgment/insight  Assessment and Plan  Well adult exam - Plan: CBC, Comprehensive metabolic panel, Lipid panel   Well 47 y.o. male. Counseled on diet and exercise. Counseled on risks and benefits of prostate cancer screening with PSA.  The patient agrees to forego screening.  Advanced directive form requested today.  Other orders as above. Follow up in 6 mo pending the above workup. The patient voiced understanding and agreement to the plan.  Jilda Roche [place], DO 03/22/22 9:46 AM

## 2022-04-19 ENCOUNTER — Other Ambulatory Visit: Payer: BC Managed Care – PPO

## 2022-04-20 ENCOUNTER — Other Ambulatory Visit: Payer: Self-pay | Admitting: Family Medicine

## 2022-07-20 ENCOUNTER — Other Ambulatory Visit: Payer: Self-pay | Admitting: Family Medicine

## 2022-09-20 ENCOUNTER — Encounter: Payer: Self-pay | Admitting: Family Medicine

## 2022-09-20 ENCOUNTER — Ambulatory Visit (INDEPENDENT_AMBULATORY_CARE_PROVIDER_SITE_OTHER): Payer: BC Managed Care – PPO | Admitting: Family Medicine

## 2022-09-20 VITALS — BP 118/70 | HR 84 | Temp 98.8°F | Ht 70.0 in | Wt 237.1 lb

## 2022-09-20 DIAGNOSIS — G2581 Restless legs syndrome: Secondary | ICD-10-CM | POA: Diagnosis not present

## 2022-09-20 DIAGNOSIS — E782 Mixed hyperlipidemia: Secondary | ICD-10-CM

## 2022-09-20 DIAGNOSIS — K219 Gastro-esophageal reflux disease without esophagitis: Secondary | ICD-10-CM | POA: Diagnosis not present

## 2022-09-20 DIAGNOSIS — Z1211 Encounter for screening for malignant neoplasm of colon: Secondary | ICD-10-CM

## 2022-09-20 LAB — LIPID PANEL
Cholesterol: 155 mg/dL (ref 0–200)
HDL: 32.8 mg/dL — ABNORMAL LOW (ref >39.00)
LDL Cholesterol: 86 mg/dL (ref 0–99)
NonHDL: 121.72
Total CHOL/HDL Ratio: 5
Triglycerides: 180 mg/dL — ABNORMAL HIGH (ref 0.0–149.0)
VLDL: 36 mg/dL (ref 0.0–40.0)

## 2022-09-20 LAB — COMPREHENSIVE METABOLIC PANEL
ALT: 32 U/L (ref 0–53)
AST: 25 U/L (ref 0–37)
Albumin: 4.5 g/dL (ref 3.5–5.2)
Alkaline Phosphatase: 67 U/L (ref 39–117)
BUN: 11 mg/dL (ref 6–23)
CO2: 24 mEq/L (ref 19–32)
Calcium: 9.6 mg/dL (ref 8.4–10.5)
Chloride: 104 mEq/L (ref 96–112)
Creatinine, Ser: 1.02 mg/dL (ref 0.40–1.50)
GFR: 89.37 mL/min (ref >60.00)
Glucose, Bld: 97 mg/dL (ref 70–99)
Potassium: 4.4 mEq/L (ref 3.5–5.1)
Sodium: 139 mEq/L (ref 135–145)
Total Bilirubin: 0.7 mg/dL (ref 0.2–1.2)
Total Protein: 7 g/dL (ref 6.0–8.3)

## 2022-09-20 NOTE — Patient Instructions (Signed)
If you do not hear anything about your referral in the next 1-2 weeks, call our office and ask for an update.  Give us 2-3 business days to get the results of your labs back.   Keep the diet clean and stay active.  Let us know if you need anything.  

## 2022-09-20 NOTE — Progress Notes (Signed)
Chief Complaint  Patient presents with   Follow-up    GERD Tyler Stevens is a 47 y.o. male who presents for follow up of GERD. Takes Protonix 40 mg/d.  He denies GERD s/s's.  Aggravating factors and specific triggers include: spicy foods, but controlled on meds. Alleviating factors include proton pump inhibitors No bleeding, unexplained wt loss, difficulty swallowing.   Hyperlipidemia Patient presents for dyslipidemia follow up. Currently being treated with Crestor 20 mg/d and compliance with treatment thus far has been good. He denies myalgias. He is usually adhering to a healthy diet. Exercise: cycling, walking The patient is not known to have coexisting coronary artery disease. No Cp or SOB.   RLS Taking Mirapex 1 mg qhs. Reports compliance, no AE's. Controlling well.   Past Medical History:  Diagnosis Date   Chest pain in adult 05/07/2018   Lateral epicondylitis of left elbow 07/25/2017   RLS (restless legs syndrome) 07/25/2017    Exam BP 118/70 (BP Location: Left Arm, Patient Position: Sitting, Cuff Size: Large)   Pulse 84   Temp 98.8 F (37.1 C) (Oral)   Ht 5\' 10"  (1.778 m)   Wt 237 lb 2 oz (107.6 kg)   SpO2 94%   BMI 34.02 kg/m  General:  well developed, well nourished, in no apparent distress Lungs:  clear to auscultation, breath sounds equal bilaterally, no respiratory distress, no wheezes Cardio:  regular rate and rhythm without murmurs, heart sounds without clicks or rubs Abdomen:  abdomen soft, nontender; bowel sounds normal; no masses or organomegaly Psych: Normal affect and mood  Assessment and Plan  Gastroesophageal reflux disease, unspecified whether esophagitis present  Mixed hyperlipidemia - Plan: Comprehensive metabolic panel, Lipid panel  RLS (restless legs syndrome)  Screen for colon cancer - Plan: Ambulatory referral to Gastroenterology  Chronic, stable.  Continue Protonix 40 mg daily.  Counseled on GERD diet and precautions. Chronic,  stable.  Continue Crestor 20 mg daily.  Check labs today.  Counseled on diet and exercise. Chronic, stable.  Continue Mirapex 1 mg nightly. We discussed colon cancer screening when he turns 47, he would like to get the ball rolling with a referral now. Follow up in 6 months for physical. Pt voiced understanding and agreement to the plan.  Jilda Roche [place], DO 09/20/22  9:11 AM

## 2022-10-21 ENCOUNTER — Other Ambulatory Visit: Payer: Self-pay | Admitting: Family Medicine

## 2023-01-18 ENCOUNTER — Other Ambulatory Visit: Payer: Self-pay | Admitting: Family Medicine

## 2023-03-24 ENCOUNTER — Encounter: Payer: Self-pay | Admitting: Family Medicine

## 2023-03-24 ENCOUNTER — Ambulatory Visit (INDEPENDENT_AMBULATORY_CARE_PROVIDER_SITE_OTHER): Payer: BC Managed Care – PPO | Admitting: Family Medicine

## 2023-03-24 VITALS — BP 122/72 | HR 86 | Temp 98.0°F | Resp 16 | Ht 70.0 in | Wt 244.8 lb

## 2023-03-24 DIAGNOSIS — G2581 Restless legs syndrome: Secondary | ICD-10-CM

## 2023-03-24 DIAGNOSIS — Z0000 Encounter for general adult medical examination without abnormal findings: Secondary | ICD-10-CM

## 2023-03-24 DIAGNOSIS — Z1211 Encounter for screening for malignant neoplasm of colon: Secondary | ICD-10-CM

## 2023-03-24 LAB — COMPREHENSIVE METABOLIC PANEL
ALT: 31 U/L (ref 0–53)
AST: 25 U/L (ref 0–37)
Albumin: 4.8 g/dL (ref 3.5–5.2)
Alkaline Phosphatase: 67 U/L (ref 39–117)
BUN: 13 mg/dL (ref 6–23)
CO2: 27 meq/L (ref 19–32)
Calcium: 9.7 mg/dL (ref 8.4–10.5)
Chloride: 103 meq/L (ref 96–112)
Creatinine, Ser: 0.99 mg/dL (ref 0.40–1.50)
GFR: 92.3 mL/min (ref >60.00)
Glucose, Bld: 99 mg/dL (ref 70–99)
Potassium: 4.7 meq/L (ref 3.5–5.1)
Sodium: 140 meq/L (ref 135–145)
Total Bilirubin: 0.7 mg/dL (ref 0.2–1.2)
Total Protein: 7.3 g/dL (ref 6.0–8.3)

## 2023-03-24 LAB — CBC
HCT: 46.6 % (ref 39.0–52.0)
Hemoglobin: 15.1 g/dL (ref 13.0–17.0)
MCHC: 32.3 g/dL (ref 30.0–36.0)
MCV: 85.5 fL (ref 78.0–100.0)
Platelets: 393 10*3/uL (ref 150.0–400.0)
RBC: 5.45 Mil/uL (ref 4.22–5.81)
RDW: 13.4 % (ref 11.5–15.5)
WBC: 7.8 10*3/uL (ref 4.0–10.5)

## 2023-03-24 LAB — LIPID PANEL
Cholesterol: 174 mg/dL (ref 0–200)
HDL: 38.3 mg/dL — ABNORMAL LOW (ref >39.00)
LDL Cholesterol: 99 mg/dL (ref 0–99)
NonHDL: 135.61
Total CHOL/HDL Ratio: 5
Triglycerides: 183 mg/dL — ABNORMAL HIGH (ref 0.0–149.0)
VLDL: 36.6 mg/dL (ref 0.0–40.0)

## 2023-03-24 MED ORDER — GABAPENTIN 300 MG PO CAPS
300.0000 mg | ORAL_CAPSULE | Freq: Every day | ORAL | 3 refills | Status: DC
Start: 2023-03-24 — End: 2023-10-30

## 2023-03-24 NOTE — Progress Notes (Signed)
 Chief Complaint  Patient presents with   Annual Exam    Annual Exam    Well Male Tyler Stevens is here for a complete physical.   His last physical was >1 year ago.  Current diet: in general, diet could be better.   Current exercise: disc golf Weight trend: increasing a little Fatigue out of ordinary? No. Seat belt? Yes.   Advanced directive? No  Health maintenance Tetanus- Yes HIV- Yes Hep C- Yes  RLS Patient has a history of RLS affecting mainly in the evenings and night at night.  He is currently taking Mirapex  1 mg nightly.  He is tolerating it well overall and it works effectively.  He is wondering if it is causing him to stutter or repeat words.  Admittedly he is not sleeping as well and thinks it is time to replace his mattress.  No racing thoughts or anxiety.  He has never been on gabapentin .  He did fail Requip  as it stopped working.  Past Medical History:  Diagnosis Date   Chest pain in adult 05/07/2018   Lateral epicondylitis of left elbow 07/25/2017   RLS (restless legs syndrome) 07/25/2017     Past Surgical History:  Procedure Laterality Date   WISDOM TOOTH EXTRACTION      Medications  Current Outpatient Medications on File Prior to Visit  Medication Sig Dispense Refill   pantoprazole  (PROTONIX ) 40 MG tablet Take 1 tablet by mouth once daily 90 tablet 0   rosuvastatin  (CRESTOR ) 20 MG tablet Take 2 tablets (40 mg total) by mouth daily. 90 tablet 3   Allergies No Known Allergies  Family History Family History  Problem Relation Age of Onset   Healthy Mother    Heart Problems Father    Arrhythmia Father    Cancer Neg Hx     Review of Systems: Constitutional: no fevers or chills Eye:  no recent significant change in vision Ear/Nose/Mouth/Throat:  Ears:  no hearing loss Nose/Mouth/Throat:  no complaints of nasal congestion, no sore throat Cardiovascular:  no chest pain Respiratory:  no shortness of breath Gastrointestinal:  no abdominal pain, no  change in bowel habits GU:  Male: negative for dysuria, frequency, and incontinence Musculoskeletal/Extremities:  no pain of the joints Integumentary (Skin/Breast):  no abnormal skin lesions reported Neurologic:  no headaches Endocrine: No unexpected weight changes Hematologic/Lymphatic:  no night sweats  Exam BP 122/72   Pulse 86   Temp 98 F (36.7 C) (Oral)   Resp 16   Ht 5' 10 (1.778 m)   Wt 244 lb 12.8 oz (111 kg)   SpO2 95%   BMI 35.13 kg/m  General:  well developed, well nourished, in no apparent distress Skin:  no significant moles, warts, or growths Head:  no masses, lesions, or tenderness Eyes:  pupils equal and round, sclera anicteric without injection Ears:  canals without lesions, TMs shiny without retraction, no obvious effusion, no erythema Nose:  nares patent, mucosa normal Throat/Pharynx:  lips and gingiva without lesion; tongue and uvula midline; non-inflamed pharynx; no exudates or postnasal drainage Neck: neck supple without adenopathy, thyromegaly, or masses Lungs:  clear to auscultation, breath sounds equal bilaterally, no respiratory distress Cardio:  regular rate and rhythm, no bruits, no LE edema Abdomen:  abdomen soft, nontender; bowel sounds normal; no masses or organomegaly Rectal: Deferred Musculoskeletal:  symmetrical muscle groups noted without atrophy or deformity Extremities:  no clubbing, cyanosis, or edema, no deformities, no skin discoloration Neuro:  gait normal; deep tendon reflexes normal  and symmetric Psych: well oriented with normal range of affect and appropriate judgment/insight  Assessment and Plan  Well adult exam - Plan: CBC, Comprehensive metabolic panel, Lipid panel  RLS (restless legs syndrome) - Plan: gabapentin  (NEURONTIN ) 300 MG capsule   Well 48 y.o. male. Counseled on diet and exercise. Advanced directive form provided today.  Flu shot politely declined.  Counseled on risks and benefits of prostate cancer screening  with PSA. The patient agrees to forego screening.  Insomnia/RLS: Chronic, possible adverse effect of medication.  Will change Mirapex  to gabapentin  300 mg qhs.  If not improving, he will let me know.  Consider getting a new mattress.  If the speech issue is not improving, would consider neurology referral versus SLP evaluation. Other orders as above. Follow up in 6 mo pending the above workup. The patient voiced understanding and agreement to the plan.  Mabel Mt [place], DO 03/24/23 8:31 AM

## 2023-03-24 NOTE — Patient Instructions (Addendum)
 Give [place] 2-3 business days to get the results of your labs back.   Keep the diet clean and stay active.  Please consider adding some weight resistance exercise to your routine. Consider yoga as well.   Please get me a copy of your advanced directive form at your convenience.   If you do not hear anything about your referral in the next 1-2 weeks, call our office and ask for an update.  Let us know if you need anything.

## 2023-04-19 ENCOUNTER — Other Ambulatory Visit: Payer: Self-pay | Admitting: Family Medicine

## 2023-09-22 ENCOUNTER — Ambulatory Visit: Payer: BC Managed Care – PPO | Admitting: Family Medicine

## 2023-10-30 ENCOUNTER — Ambulatory Visit (INDEPENDENT_AMBULATORY_CARE_PROVIDER_SITE_OTHER): Admitting: Family Medicine

## 2023-10-30 ENCOUNTER — Encounter: Payer: Self-pay | Admitting: Family Medicine

## 2023-10-30 VITALS — BP 124/78 | HR 95 | Temp 98.0°F | Resp 16 | Ht 70.0 in | Wt 251.0 lb

## 2023-10-30 DIAGNOSIS — E782 Mixed hyperlipidemia: Secondary | ICD-10-CM | POA: Diagnosis not present

## 2023-10-30 DIAGNOSIS — K219 Gastro-esophageal reflux disease without esophagitis: Secondary | ICD-10-CM | POA: Diagnosis not present

## 2023-10-30 DIAGNOSIS — G2581 Restless legs syndrome: Secondary | ICD-10-CM

## 2023-10-30 MED ORDER — ROSUVASTATIN CALCIUM 40 MG PO TABS: 40.0000 mg | ORAL_TABLET | Freq: Every day | ORAL | 3 refills | Status: AC

## 2023-10-30 MED ORDER — PRAMIPEXOLE DIHYDROCHLORIDE 1 MG PO TABS
1.0000 mg | ORAL_TABLET | Freq: Every evening | ORAL | 2 refills | Status: AC
Start: 2023-11-29 — End: ?

## 2023-10-30 MED ORDER — PRAMIPEXOLE DIHYDROCHLORIDE 0.5 MG PO TABS
0.5000 mg | ORAL_TABLET | Freq: Every evening | ORAL | 0 refills | Status: AC
Start: 2023-10-30 — End: 2023-11-29

## 2023-10-30 NOTE — Patient Instructions (Signed)
 Give us  2-3 business days to get the results of your labs back.   Please contact the GI team at: [phone]  Keep the diet clean and stay active.  Aim to do some physical exertion for 150 minutes per week. This is typically divided into 5 days per week, 30 minutes per day. The activity should be enough to get your heart rate up. Anything is better than nothing if you have time constraints.  Let us  know if you need anything.

## 2023-10-30 NOTE — Progress Notes (Signed)
 Chief Complaint  Patient presents with   Extremity Weakness    Restless Leg    Subjective: Hyperlipidemia Patient presents for Hyperlipidemia follow up. Currently taking Crestor  40 mg/d and compliance with treatment thus far has been good. He denies myalgias. He is not always adhering to a healthy diet. Exercise: none No CP or SOB.  The patient is not known to have coexisting coronary artery disease.  GERD Taking Protonix  40 mg/d. Compliant, no AE's. S/s's controlled. No difficulty swallowing, bleeding, unintentional wt loss, pain.   RLS Struggling on Neurontin . Did pretty well on Mirapex . Would like to go back on it.  Past Medical History:  Diagnosis Date   Chest pain in adult 05/07/2018   Lateral epicondylitis of left elbow 07/25/2017   RLS (restless legs syndrome) 07/25/2017    Objective: BP 124/78 (BP Location: Left Arm, Patient Position: Sitting)   Pulse 95   Temp 98 F (36.7 C) (Oral)   Resp 16   Ht 5' 10 (1.778 m)   Wt 251 lb (113.9 kg)   SpO2 99%   BMI 36.01 kg/m  General: Awake, appears stated age Abd: BS+, S, NT, ND Heart: RRR, no LE edema, no bruits Lungs: CTAB, no rales, wheezes or rhonchi. No accessory muscle use Psych: Age appropriate judgment and insight, normal affect and mood  Assessment and Plan: Mixed hyperlipidemia - Plan: Comprehensive metabolic panel with GFR, Lipid panel  Gastroesophageal reflux disease, unspecified whether esophagitis present  RLS (restless legs syndrome) - Plan: pramipexole  (MIRAPEX ) 0.5 MG tablet, pramipexole  (MIRAPEX ) 1 MG tablet  Chronic, stable.  Continue Crestor  40 mg daily.  Counseled on diet and exercise. Chronic, stable.  Continue Protonix  40 mg daily.  Discussed reflux precautions. Chronic, not controlled.  Stop gabapentin , start Mirapex  0.5 mg nightly, increase to 1 mg nightly after 30 days.  He did well on this combination. She information provided in the AVS. F/u in 6 mo. The patient voiced understanding and  agreement to the plan.  Mabel Mt [place], DO 10/30/23  4:46 PM

## 2023-10-31 ENCOUNTER — Other Ambulatory Visit (INDEPENDENT_AMBULATORY_CARE_PROVIDER_SITE_OTHER)

## 2023-10-31 ENCOUNTER — Ambulatory Visit: Payer: Self-pay | Admitting: Family Medicine

## 2023-10-31 DIAGNOSIS — E782 Mixed hyperlipidemia: Secondary | ICD-10-CM

## 2023-10-31 LAB — COMPREHENSIVE METABOLIC PANEL WITH GFR
ALT: 31 U/L (ref 0–53)
AST: 25 U/L (ref 0–37)
Albumin: 4.7 g/dL (ref 3.5–5.2)
Alkaline Phosphatase: 65 U/L (ref 39–117)
BUN: 9 mg/dL (ref 6–23)
CO2: 29 meq/L (ref 19–32)
Calcium: 9.6 mg/dL (ref 8.4–10.5)
Chloride: 105 meq/L (ref 96–112)
Creatinine, Ser: 1.03 mg/dL (ref 0.40–1.50)
GFR: 87.64 mL/min (ref >60.00)
Glucose, Bld: 96 mg/dL (ref 70–99)
Potassium: 5.1 meq/L (ref 3.5–5.1)
Sodium: 142 meq/L (ref 135–145)
Total Bilirubin: 0.7 mg/dL (ref 0.2–1.2)
Total Protein: 7.4 g/dL (ref 6.0–8.3)

## 2023-10-31 LAB — LIPID PANEL
Cholesterol: 159 mg/dL (ref 0–200)
HDL: 35.7 mg/dL — ABNORMAL LOW (ref >39.00)
LDL Cholesterol: 84 mg/dL (ref 0–99)
NonHDL: 123.48
Total CHOL/HDL Ratio: 4
Triglycerides: 197 mg/dL — ABNORMAL HIGH (ref 0.0–149.0)
VLDL: 39.4 mg/dL (ref 0.0–40.0)

## 2024-02-06 ENCOUNTER — Other Ambulatory Visit: Payer: Self-pay | Admitting: Family Medicine

## 2024-02-06 DIAGNOSIS — G2581 Restless legs syndrome: Secondary | ICD-10-CM

## 2024-02-12 ENCOUNTER — Encounter: Payer: Self-pay | Admitting: Family Medicine

## 2024-02-13 ENCOUNTER — Other Ambulatory Visit: Payer: Self-pay

## 2024-02-13 DIAGNOSIS — G2581 Restless legs syndrome: Secondary | ICD-10-CM

## 2024-02-13 MED ORDER — GABAPENTIN 300 MG PO CAPS: 300.0000 mg | ORAL_CAPSULE | Freq: Every day | ORAL | 1 refills | Status: AC

## 2024-03-10 ENCOUNTER — Other Ambulatory Visit: Payer: Self-pay | Admitting: Family Medicine

## 2024-04-30 ENCOUNTER — Encounter: Admitting: Family Medicine

## 2024-05-01 ENCOUNTER — Encounter: Admitting: Family Medicine
